# Patient Record
Sex: Male | Born: 1950 | Race: White | Hispanic: No | Marital: Married | Smoking: Former smoker
Health system: Southern US, Community
[De-identification: ages and names within clinical notes are randomized; demographics above are authoritative.]

## PROBLEM LIST (undated history)

## (undated) DIAGNOSIS — I10 Essential (primary) hypertension: Secondary | ICD-10-CM

## (undated) DIAGNOSIS — F329 Major depressive disorder, single episode, unspecified: Secondary | ICD-10-CM

## (undated) DIAGNOSIS — E669 Obesity, unspecified: Secondary | ICD-10-CM

## (undated) DIAGNOSIS — M1A9XX0 Chronic gout, unspecified, without tophus (tophi): Secondary | ICD-10-CM

## (undated) DIAGNOSIS — K222 Esophageal obstruction: Secondary | ICD-10-CM

## (undated) DIAGNOSIS — K219 Gastro-esophageal reflux disease without esophagitis: Secondary | ICD-10-CM

## (undated) DIAGNOSIS — J45909 Unspecified asthma, uncomplicated: Secondary | ICD-10-CM

## (undated) DIAGNOSIS — K5730 Diverticulosis of large intestine without perforation or abscess without bleeding: Secondary | ICD-10-CM

## (undated) DIAGNOSIS — K449 Diaphragmatic hernia without obstruction or gangrene: Secondary | ICD-10-CM

## (undated) DIAGNOSIS — Z800 Family history of malignant neoplasm of digestive organs: Secondary | ICD-10-CM

## (undated) DIAGNOSIS — F32A Depression, unspecified: Secondary | ICD-10-CM

## (undated) DIAGNOSIS — T7840XA Allergy, unspecified, initial encounter: Secondary | ICD-10-CM

## (undated) DIAGNOSIS — E785 Hyperlipidemia, unspecified: Secondary | ICD-10-CM

## (undated) DIAGNOSIS — M1A00X0 Idiopathic chronic gout, unspecified site, without tophus (tophi): Secondary | ICD-10-CM

## (undated) DIAGNOSIS — R1310 Dysphagia, unspecified: Secondary | ICD-10-CM

## (undated) DIAGNOSIS — E119 Type 2 diabetes mellitus without complications: Secondary | ICD-10-CM

## (undated) DIAGNOSIS — G4730 Sleep apnea, unspecified: Secondary | ICD-10-CM

## (undated) HISTORY — DX: Hyperlipidemia, unspecified: E78.5

## (undated) HISTORY — DX: Sleep apnea, unspecified: G47.30

## (undated) HISTORY — DX: Dysphagia, unspecified: R13.10

## (undated) HISTORY — DX: Allergy, unspecified, initial encounter: T78.40XA

## (undated) HISTORY — DX: Unspecified asthma, uncomplicated: J45.909

## (undated) HISTORY — DX: Diverticulosis of large intestine without perforation or abscess without bleeding: K57.30

## (undated) HISTORY — PX: NASAL SEPTUM SURGERY: SHX37

## (undated) HISTORY — DX: Chronic gout, unspecified, without tophus (tophi): M1A.9XX0

## (undated) HISTORY — DX: Esophageal obstruction: K22.2

## (undated) HISTORY — DX: Depression, unspecified: F32.A

## (undated) HISTORY — PX: ANKLE SURGERY: SHX546

## (undated) HISTORY — PX: ESOPHAGOGASTRODUODENOSCOPY: SHX1529

## (undated) HISTORY — DX: Family history of malignant neoplasm of digestive organs: Z80.0

## (undated) HISTORY — DX: Obesity, unspecified: E66.9

## (undated) HISTORY — PX: UMBILICAL HERNIA REPAIR: SHX196

## (undated) HISTORY — DX: Essential (primary) hypertension: I10

## (undated) HISTORY — DX: Idiopathic chronic gout, unspecified site, without tophus (tophi): M1A.00X0

## (undated) HISTORY — DX: Gastro-esophageal reflux disease without esophagitis: K21.9

## (undated) HISTORY — DX: Diaphragmatic hernia without obstruction or gangrene: K44.9

## (undated) HISTORY — DX: Major depressive disorder, single episode, unspecified: F32.9

## (undated) HISTORY — PX: COLONOSCOPY: SHX174

## (undated) HISTORY — DX: Type 2 diabetes mellitus without complications: E11.9

---

## 1998-09-24 ENCOUNTER — Ambulatory Visit (HOSPITAL_COMMUNITY): Admission: RE | Admit: 1998-09-24 | Discharge: 1998-09-24 | Payer: Self-pay | Admitting: Gastroenterology

## 2001-07-13 ENCOUNTER — Encounter: Payer: Self-pay | Admitting: Pulmonary Disease

## 2001-09-13 ENCOUNTER — Encounter: Payer: Self-pay | Admitting: Gastroenterology

## 2004-09-09 ENCOUNTER — Ambulatory Visit: Payer: Self-pay | Admitting: Gastroenterology

## 2004-09-28 ENCOUNTER — Ambulatory Visit: Payer: Self-pay | Admitting: Gastroenterology

## 2005-11-18 ENCOUNTER — Encounter: Payer: Self-pay | Admitting: Pulmonary Disease

## 2006-09-07 ENCOUNTER — Encounter: Admission: RE | Admit: 2006-09-07 | Discharge: 2006-09-07 | Payer: Self-pay | Admitting: Sports Medicine

## 2008-12-04 DIAGNOSIS — K449 Diaphragmatic hernia without obstruction or gangrene: Secondary | ICD-10-CM | POA: Insufficient documentation

## 2008-12-04 DIAGNOSIS — K222 Esophageal obstruction: Secondary | ICD-10-CM

## 2008-12-04 DIAGNOSIS — R1310 Dysphagia, unspecified: Secondary | ICD-10-CM | POA: Insufficient documentation

## 2008-12-04 DIAGNOSIS — I10 Essential (primary) hypertension: Secondary | ICD-10-CM | POA: Insufficient documentation

## 2008-12-04 DIAGNOSIS — F329 Major depressive disorder, single episode, unspecified: Secondary | ICD-10-CM

## 2008-12-04 DIAGNOSIS — M1A00X0 Idiopathic chronic gout, unspecified site, without tophus (tophi): Secondary | ICD-10-CM | POA: Insufficient documentation

## 2008-12-04 DIAGNOSIS — K5730 Diverticulosis of large intestine without perforation or abscess without bleeding: Secondary | ICD-10-CM | POA: Insufficient documentation

## 2008-12-04 DIAGNOSIS — G4730 Sleep apnea, unspecified: Secondary | ICD-10-CM | POA: Insufficient documentation

## 2008-12-04 DIAGNOSIS — E663 Overweight: Secondary | ICD-10-CM | POA: Insufficient documentation

## 2008-12-05 ENCOUNTER — Ambulatory Visit: Payer: Self-pay | Admitting: Gastroenterology

## 2008-12-05 DIAGNOSIS — E1165 Type 2 diabetes mellitus with hyperglycemia: Secondary | ICD-10-CM

## 2009-02-03 ENCOUNTER — Ambulatory Visit: Payer: Self-pay | Admitting: Gastroenterology

## 2009-02-03 ENCOUNTER — Encounter: Payer: Self-pay | Admitting: Gastroenterology

## 2009-02-03 DIAGNOSIS — K2970 Gastritis, unspecified, without bleeding: Secondary | ICD-10-CM | POA: Insufficient documentation

## 2009-02-03 DIAGNOSIS — K2990 Gastroduodenitis, unspecified, without bleeding: Secondary | ICD-10-CM

## 2009-02-05 ENCOUNTER — Encounter: Payer: Self-pay | Admitting: Gastroenterology

## 2009-02-10 ENCOUNTER — Telehealth: Payer: Self-pay | Admitting: Gastroenterology

## 2009-02-14 ENCOUNTER — Ambulatory Visit: Payer: Self-pay | Admitting: Pulmonary Disease

## 2009-02-14 DIAGNOSIS — G4733 Obstructive sleep apnea (adult) (pediatric): Secondary | ICD-10-CM

## 2009-02-24 ENCOUNTER — Encounter: Admission: RE | Admit: 2009-02-24 | Discharge: 2009-02-24 | Payer: Self-pay | Admitting: Endocrinology

## 2009-03-07 ENCOUNTER — Telehealth (INDEPENDENT_AMBULATORY_CARE_PROVIDER_SITE_OTHER): Payer: Self-pay | Admitting: *Deleted

## 2009-03-11 ENCOUNTER — Telehealth (INDEPENDENT_AMBULATORY_CARE_PROVIDER_SITE_OTHER): Payer: Self-pay | Admitting: *Deleted

## 2009-03-14 ENCOUNTER — Ambulatory Visit: Payer: Self-pay | Admitting: Pulmonary Disease

## 2009-04-29 ENCOUNTER — Telehealth: Payer: Self-pay | Admitting: Pulmonary Disease

## 2009-05-03 ENCOUNTER — Encounter: Payer: Self-pay | Admitting: Pulmonary Disease

## 2010-08-12 ENCOUNTER — Telehealth: Payer: Self-pay | Admitting: Pulmonary Disease

## 2010-08-13 ENCOUNTER — Encounter: Payer: Self-pay | Admitting: Pulmonary Disease

## 2010-08-13 ENCOUNTER — Ambulatory Visit (INDEPENDENT_AMBULATORY_CARE_PROVIDER_SITE_OTHER): Payer: Self-pay | Admitting: Pulmonary Disease

## 2010-08-13 DIAGNOSIS — G4733 Obstructive sleep apnea (adult) (pediatric): Secondary | ICD-10-CM

## 2010-08-19 NOTE — Progress Notes (Signed)
Summary: needs new cpap machine  Phone Note Call from Patient   Caller: Patient Call For: [facility] Summary of Call: patient phoned stated that he needed an order for a new CPAP machine. He uses [facility]. He uses the one with the heated humidifier. Patient can be reached at [phone] Initial call taken by: Vedia Coffer,  August 12, 2010 2:06 PM  Follow-up for Phone Call        Pt states he has had his CPAP machine for approx 8 years now and it is making funny noises. It is still working for now but would like to arrange for a new CPAP through [facility]. Pls advise.Michel [facility]  August 12, 2010 3:51 PM  Additional Follow-up for Phone Call Additional follow up Details #1::        I haven't seen pt in 1 1/2 years....have to have OV before I can do this .Marland KitchenMarland KitchenMarland KitchenN[place] law. Additional Follow-up by: Barbaraann Share MD,  August 12, 2010 4:29 PM    Additional Follow-up for Phone Call Additional follow up Details #2::    pt set to see [facility] tomorrow at 9:30am.Jennifer [facility] CMA  August 12, 2010 4:37 PM

## 2010-09-03 NOTE — Assessment & Plan Note (Signed)
Summary: rov for osa   Copy to:  [place] Primary Provider/Referring Provider:  [facility]  CC:  Follow up appt for OSA.  Pt hasn't been seen since Sept 2010.  Pt is requesting a new cpap machine- states his "makes a funny noise."  Wears cpap machine every night.  Approx 7 hours per night.   Pt denied any complaints with mask or pressure. Marland Kitchen  History of Present Illness: the pt comes in today for f/u of his osa.  He has not been seen since 2010, but has been wearing cpap compliantly.  He had a lot of mask fit issues, but now is doing very well with the resmed quattro fx full face mask.  Overall, he feels he sleeps well and is satisfied with his daytime alertness.  He is working on weight loss.  He denies any pressure tolerance issues.    Current Medications (verified): 1)  Testim 1 % Gel (Testosterone) .... Once Daily 2)  Nexium 40 Mg Cpdr (Esomeprazole Magnesium) .... Once Daily 3)  Clarinex 5 Mg Tabs (Desloratadine) .... Once Daily As Needed 4)  Glimepiride 2 Mg Tabs (Glimepiride) .... Take 1 Tablet 2 To 3 Times Daily As Needed 5)  Hctz .... Once Daily 6)  Tarka 4-240 Mg Cr-Tabs (Trandolapril-Verapamil Hcl) .... Once Daily 7)  Fish Oil 1000 Mg Caps (Omega-3 Fatty Acids) .... Two Times A Day 8)  Citrucel 500 Mg Tabs (Methylcellulose (Laxative)) .... Two Times A Day 9)  Osteo Bi-Flex Adv Double St  Caps (Misc Natural Products) .... Two Times A Day 10)  Nasonex 50 Mcg/act Susp (Mometasone Furoate) .... As Needed 11)  Astepro 0.15 % Soln (Azelastine Hcl) .... As Needed 12)  Multivitamins  Tabs (Multiple Vitamin) .... Two Times A Day 13)  Probiotic  Caps (Misc Intestinal Flora Regulat) .... Once Daily 14)  Aspirin Low Dose 81 Mg Tabs (Aspirin) .... Take 1 Tablet By Mouth Once A Day 15)  Crestor (Unsure of Dosage) .... Take 1 Tablet By Mouth Once A Day  Allergies (verified): 1)  ! Sulfa  Review of Systems  The patient denies shortness of breath with activity, shortness of  breath at rest, productive cough, non-productive cough, coughing up blood, chest pain, irregular heartbeats, acid heartburn, indigestion, loss of appetite, weight change, abdominal pain, difficulty swallowing, sore throat, tooth/dental problems, headaches, nasal congestion/difficulty breathing through nose, sneezing, itching, ear ache, anxiety, depression, hand/feet swelling, joint stiffness or pain, rash, change in color of mucus, and fever.    Vital Signs:  Patient profile:   60 year old male Height:      72 inches Weight:      232 pounds BMI:     31.58 O2 Sat:      94 % on Room air Temp:     98.3 degrees F oral Pulse rate:   71 / minute BP sitting:   140 / 80  (left arm) Cuff size:   regular  Vitals Entered By: Arman Filter LPN (August 13, 2010 9:16 AM)  O2 Flow:  Room air CC: Follow up appt for OSA.  Pt hasn't been seen since Sept 2010.  Pt is requesting a new cpap machine- states his "makes a funny noise."  Wears cpap machine every night.  Approx 7 hours per night.   Pt denied any complaints with mask or pressure.  Comments Medications reviewed with patient Arman Filter LPN  August 13, 2010 9:16 AM    Physical Exam  General:  40 male in  nad Nose:  no skin breakdown or pressure necrosis from cpap mask Extremities:  no edema or cyanosis  Neurologic:  alert, does not appear sleepy, moves all 4.   Impression & Recommendations:  Problem # 1:  OBSTRUCTIVE SLEEP APNEA (ICD-327.23) the pt has known osa and has not followed up with me in quite some time.  He is due for a new machine, and has recently found a comfortable mask that is well fitting.  Will re-optimize pressure on auto mode for a few weeks, then set his machine on the appropriate setting.  I have also encouraged the pt to work on weight loss.  Medications Added to Medication List This Visit: 1)  Clarinex 5 Mg Tabs (Desloratadine) .... Once daily as needed 2)  Hctz  .... Once daily  Other Orders: Est. Patient  Level III (16109) DME Referral (DME)  Patient Instructions: 1)  will get you a new cpap machine and set on auto mode to begin with.  let me know if this is more comfortable for you 2)  work on weight loss 3)  followup with me in one year.

## 2010-10-03 LAB — GLUCOSE, CAPILLARY: Glucose-Capillary: 136 mg/dL — ABNORMAL HIGH (ref 70–99)

## 2010-10-08 ENCOUNTER — Telehealth: Payer: Self-pay | Admitting: Gastroenterology

## 2010-10-08 NOTE — Telephone Encounter (Signed)
Pt with hx of DM, GERD, COLON on 09/28/2004 and 02/03/09 with diverticulosis,otherwise normal.  Colon on 09/28/2004 showing HH, Distant Esophageal Stricture and on 02/03/2009 showing Gastritis and possible occult stricture dilation. LMOM for pt to call back.

## 2010-10-13 NOTE — Telephone Encounter (Signed)
LMOM for pt to call back.

## 2010-10-16 NOTE — Telephone Encounter (Signed)
Pt never called back after 2 messages left.

## 2011-01-14 ENCOUNTER — Telehealth: Payer: Self-pay | Admitting: Pulmonary Disease

## 2011-01-14 NOTE — Telephone Encounter (Signed)
KC, pls advise results of download thanks

## 2011-01-15 ENCOUNTER — Other Ambulatory Visit: Payer: Self-pay | Admitting: Pulmonary Disease

## 2011-01-15 DIAGNOSIS — G4733 Obstructive sleep apnea (adult) (pediatric): Secondary | ICD-10-CM

## 2011-01-15 NOTE — Telephone Encounter (Signed)
Done.   Optimal pressure 14cm, but compliance is poor.  He needs to work on wearing longer and more compliantly

## 2011-01-15 NOTE — Telephone Encounter (Signed)
Download received.  KC has already reviewed the download and order was sent to [facility] for pressure change. Will close this message.

## 2011-08-16 ENCOUNTER — Ambulatory Visit (INDEPENDENT_AMBULATORY_CARE_PROVIDER_SITE_OTHER): Payer: BC Managed Care – PPO | Admitting: Pulmonary Disease

## 2011-08-16 ENCOUNTER — Encounter: Payer: Self-pay | Admitting: Pulmonary Disease

## 2011-08-16 VITALS — BP 136/80 | HR 84 | Temp 97.9°F | Ht 72.0 in | Wt 247.8 lb

## 2011-08-16 DIAGNOSIS — G4733 Obstructive sleep apnea (adult) (pediatric): Secondary | ICD-10-CM

## 2011-08-16 NOTE — Progress Notes (Signed)
  Subjective:    Patient ID: [name] [name], male    DOB: 07-20-1950, 61 y.o.   MRN: [id number]  HPI Patient comes in today for followup of his known obstructive sleep apnea.  He has been wearing CPAP compliantly, and has finally found a good mask that seals well.  He is having a significant issue with leaks.  He is sleeping almost 7 hours a night, and is satisfied with his daytime alertness.  However, he is asking whether his pressure needs to be adjusted.  He has gained 15 pounds in the last one year.   Review of Systems  Constitutional: Negative for fever and unexpected weight change.  HENT: Positive for nosebleeds. Negative for ear pain, congestion, sore throat, rhinorrhea, sneezing, trouble swallowing, dental problem, postnasal drip and sinus pressure.   Eyes: Negative for redness and itching.  Respiratory: Negative for cough, chest tightness, shortness of breath and wheezing.   Cardiovascular: Negative for palpitations and leg swelling.  Gastrointestinal: Negative for nausea and vomiting.  Genitourinary: Negative for dysuria.  Musculoskeletal: Negative for joint swelling.  Skin: Negative for rash.  Neurological: Negative for headaches.  Hematological: Bruises/bleeds easily.  Psychiatric/Behavioral: Negative for dysphoric mood. The patient is not nervous/anxious.        Objective:   Physical Exam Overweight male in no acute distress No skin breakdown or pressure necrosis from the CPAP mask Lower extremities without significant edema, and no cyanosis noted Alert, does not appear to be sleepy, moves all 4 extremities.       Assessment & Plan:

## 2011-08-16 NOTE — Patient Instructions (Signed)
Will set your machine on auto for 2 weeks to re-optimize your pressure.  Will let you know the results. Work on weight loss. followup with me in 1 year, but call if having issues.

## 2011-08-16 NOTE — Assessment & Plan Note (Signed)
The patient is doing fairly well with CPAP, and is having no issues with his mask fit currently.  He is asking whether he needs to have his pressure checked again, and this is fairly easy to do with his current machine.  He has gained 15 pounds since last visit, and may require a higher pressure.  I've encouraged him to work aggressively on weight loss, and to keep up with mask changes and supplies.

## 2011-10-17 ENCOUNTER — Other Ambulatory Visit: Payer: Self-pay | Admitting: Pulmonary Disease

## 2011-10-17 DIAGNOSIS — G4733 Obstructive sleep apnea (adult) (pediatric): Secondary | ICD-10-CM

## 2011-11-04 ENCOUNTER — Telehealth: Payer: Self-pay | Admitting: Gastroenterology

## 2011-11-04 NOTE — Telephone Encounter (Signed)
lmom for pt to call back. Per note on 02/03/2009, he was routine risk and to repeat in 10 years or 2020. Asked him to call back if he wants Dr Jarold Motto to review.

## 2011-11-11 ENCOUNTER — Telehealth: Payer: Self-pay | Admitting: Gastroenterology

## 2011-11-11 NOTE — Telephone Encounter (Signed)
lmom for pt to call back. I have sent for his chart, but if he's having problems, he needs to inform us and colon could be done earlier than recall date.

## 2011-11-11 NOTE — Telephone Encounter (Signed)
Finally got pt's chart and pt had COLONs on 08/17/1993, repeat in 5 yrs is - stool cards; 3/29/200, repeat in 5 years if - stool cards; 09/13/2001 same recall rules; 08/26/2004, same rules for recall. Pt's parent had cancer at age 61. Our system states recall is August, 2020.  This is the 2nd call on this matter, but I haven't spoke to him. Dr Jarold Motto, can you review and advise? Thanks.

## 2011-11-12 NOTE — Telephone Encounter (Signed)
[name] [name] for pt that Dr Jarold Motto reviewed his chart and he OK'd a COLON. He may call me back or ask the receptionist to schedule a Direct COLON.

## 2011-11-26 ENCOUNTER — Telehealth: Payer: Self-pay | Admitting: Gastroenterology

## 2011-11-26 NOTE — Telephone Encounter (Signed)
Pt called back this am to discuss the dates of COLONs. Informed Dr Jarold Motto reviewed his chart on the last call on 11/11/11 and approved a DIRECT COLON for now if he would like. He decided he would like the recall made for 2015 instead of 2020; changed. Pt will call for further issues.

## 2012-08-15 ENCOUNTER — Ambulatory Visit: Payer: BC Managed Care – PPO | Admitting: Pulmonary Disease

## 2012-10-06 ENCOUNTER — Encounter: Payer: Self-pay | Admitting: Pulmonary Disease

## 2012-10-06 ENCOUNTER — Ambulatory Visit (INDEPENDENT_AMBULATORY_CARE_PROVIDER_SITE_OTHER): Payer: BC Managed Care – PPO | Admitting: Pulmonary Disease

## 2012-10-06 ENCOUNTER — Telehealth: Payer: Self-pay | Admitting: *Deleted

## 2012-10-06 VITALS — BP 140/82 | HR 92 | Temp 98.2°F | Ht 72.0 in | Wt 227.4 lb

## 2012-10-06 DIAGNOSIS — G4733 Obstructive sleep apnea (adult) (pediatric): Secondary | ICD-10-CM

## 2012-10-06 NOTE — Telephone Encounter (Signed)
Download has been received from [facility] and in your green folder for review per Pt request.

## 2012-10-06 NOTE — Patient Instructions (Addendum)
Stay on cpap.  Will call you once download received. Work on weight loss followup with me in one year if doing well.

## 2012-10-06 NOTE — Progress Notes (Signed)
  Subjective:    Patient ID: [name] [name], male    DOB: 07/30/1950, 62 y.o.   MRN: [id number]  HPI The pt comes in today for f/u of his known osa.  He is wearing cpap compliantly on the auto setting, and was never changed over to 13cm.  He is having no mask or pressure issues, and feels he sleeps well with excellent daytime alertness. 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    Review of Systems  Constitutional: Negative for fever and unexpected weight change.  HENT: Positive for congestion ( allergies), rhinorrhea and postnasal drip. Negative for ear pain, nosebleeds, sore throat, sneezing, trouble swallowing, dental problem and sinus pressure.   Eyes: Negative for redness and itching.  Respiratory: Negative for cough, chest tightness, shortness of breath and wheezing.   Cardiovascular: Negative for palpitations and leg swelling.  Gastrointestinal: Negative for nausea and vomiting.  Genitourinary: Negative for dysuria.  Musculoskeletal: Negative for joint swelling.  Skin: Negative for rash.  Neurological: Negative for headaches.  Hematological: Does not bruise/bleed easily.  Psychiatric/Behavioral: Negative for dysphoric mood. The patient is not nervous/anxious.        Objective:   Physical Exam Ow male in nad Nose without purulence or discharge noted. No skin breakdown or pressure necrosis from the cpap mask. LE without edema, no cyanosis Alert, does not appear sleepy, moves all 4.        Assessment & Plan:

## 2012-10-06 NOTE — Assessment & Plan Note (Signed)
The pt is doing very well with cpap, and feels his sleep and daytime alertness are adequate.  He is keeping up with his supplies and mask cushions, and is trying to lose some weight.  I have asked him to keep working on this, and to f/u with me in one year if doing well.

## 2012-10-17 ENCOUNTER — Telehealth: Payer: Self-pay | Admitting: Pulmonary Disease

## 2012-10-17 NOTE — Telephone Encounter (Addendum)
LMOM x 1  These results are in Dr Shelle Iron review folder---pt requesting these results.

## 2012-10-18 NOTE — Telephone Encounter (Signed)
Encounter closed in error, sorry

## 2012-10-18 NOTE — Telephone Encounter (Signed)
KC, please advise results thanks

## 2012-10-20 NOTE — Telephone Encounter (Signed)
Let pt know that his download shows excellent control of his sleep apnea on the auto setting.  He can either stay here, or be set on 12-13cm.  His choice.  No need to send back unless he wants to change.

## 2012-10-20 NOTE — Telephone Encounter (Signed)
LMTCB

## 2012-10-23 NOTE — Telephone Encounter (Signed)
Spoke with the pt and notified of recs per [facility] He verbalized understanding Wants to keep CPAP set on auto  Nothing further needed

## 2012-12-19 ENCOUNTER — Encounter: Payer: Self-pay | Admitting: Pulmonary Disease

## 2013-01-19 ENCOUNTER — Other Ambulatory Visit: Payer: Self-pay | Admitting: *Deleted

## 2013-01-19 ENCOUNTER — Other Ambulatory Visit: Payer: BC Managed Care – PPO

## 2013-01-19 ENCOUNTER — Other Ambulatory Visit (INDEPENDENT_AMBULATORY_CARE_PROVIDER_SITE_OTHER): Payer: BC Managed Care – PPO

## 2013-01-19 DIAGNOSIS — E291 Testicular hypofunction: Secondary | ICD-10-CM

## 2013-01-19 DIAGNOSIS — E119 Type 2 diabetes mellitus without complications: Secondary | ICD-10-CM

## 2013-01-19 LAB — MICROALBUMIN / CREATININE URINE RATIO
Creatinine,U: 136 mg/dL
Microalb Creat Ratio: 0.8 mg/g (ref 0.0–30.0)
Microalb, Ur: 1.1 mg/dL (ref 0.0–1.9)

## 2013-01-19 LAB — URINALYSIS
Ketones, ur: NEGATIVE
Leukocytes, UA: NEGATIVE
Nitrite: NEGATIVE
Specific Gravity, Urine: 1.02 (ref 1.000–1.030)
Urobilinogen, UA: 0.2 (ref 0.0–1.0)
pH: 6 (ref 5.0–8.0)

## 2013-01-19 LAB — COMPREHENSIVE METABOLIC PANEL
ALT: 36 U/L (ref 0–53)
BUN: 18 mg/dL (ref 6–23)
CO2: 29 mEq/L (ref 19–32)
Creatinine, Ser: 1 mg/dL (ref 0.4–1.5)
GFR: 78.59 mL/min (ref >60.00)
Total Bilirubin: 1.1 mg/dL (ref 0.3–1.2)

## 2013-01-19 LAB — HEMOGLOBIN A1C: Hgb A1c MFr Bld: 6.6 % — ABNORMAL HIGH (ref 4.6–6.5)

## 2013-01-23 ENCOUNTER — Encounter: Payer: Self-pay | Admitting: Endocrinology

## 2013-01-23 ENCOUNTER — Ambulatory Visit (INDEPENDENT_AMBULATORY_CARE_PROVIDER_SITE_OTHER): Payer: BC Managed Care – PPO | Admitting: Endocrinology

## 2013-01-23 ENCOUNTER — Other Ambulatory Visit: Payer: Self-pay | Admitting: *Deleted

## 2013-01-23 VITALS — BP 130/68 | HR 81 | Temp 97.9°F | Resp 12 | Ht 72.0 in | Wt 230.6 lb

## 2013-01-23 DIAGNOSIS — E785 Hyperlipidemia, unspecified: Secondary | ICD-10-CM

## 2013-01-23 DIAGNOSIS — E119 Type 2 diabetes mellitus without complications: Secondary | ICD-10-CM

## 2013-01-23 MED ORDER — GLIPIZIDE 5 MG PO TABS: 5.0000 mg | ORAL_TABLET | Freq: Every day | ORAL | Status: DC

## 2013-01-23 NOTE — Progress Notes (Signed)
Patient ID: [name] [name], male   DOB: [birth year], 62 y.o.   MRN: [id number]  Reason for Appointment: Diabetes follow-up   History of Present Illness   DIAGNOSIS:  Initially made in 2010, Type 2 diabetes mellitus.    He is on a regimen of Victoza 1.8 mg and glipizide ER 5 mg daily and also Invokana. With adding Invokana his A1c has improved as well as he has less postprandial hyperglycemia. No side effects from this.  Although he has not lost any weight recently he is trying to  exercise more regularly. Will have occasional higher postprandial readings but overall blood sugars are excellent.   Peripheral neuropathy: None.  Diabetic nephropathy: Microalbumin ratio: 6.1.  Side effects from medications: None.  Proper timing of medications in relation to meals: Yes.  Monitors blood glucose: Once a day.  Glucometer: One Touch. Did not bring his monitor  Blood Glucose readings: PC high readings of 160 PC. Fasting  average 130   Hypoglycemia frequency: None recently.  Meals: content is variable, he finds that high carbohydrate foods and potato chips will raise his blood sugar. Sometimes hi fat Physical activity: exercise: using bike 3/7.  Certified Diabetes Educator visit: Most recent: 3/13.   Dietician visit: Most recent: 4/13. Following recommendations were made: 1. he will add carbohydrates back in to his diet and control them to 30-75 grams per meal. 2. Patient will limit his meat portions to 4-6 oz. at supper.  3. Patient will cut his use of added fat and fried foods in half. 4. Patient will limit snacks to 200 calories or less at night..   The last HbgA1c was 6.3% on 08/30/12; previous levels: 6.8 in 3/13  Hyperlipidemia:   Patient is on low-dose Crestor and recently on niacin also. No history of CAD.  Last LDL on record is 96 and 03/2012 LDL particle number initially was 1632 . He was advised to start OTC Niacin 500 mg in addition to his low dose Crestor.  He was however sent by his  PCP to do [facility] and they have finally started niacin   He has had myalgias With higher doses of Crestor previously No recent levels available from his visit at [facility] recently    Appointment on 01/19/2013  Component Date Value Range Status  . Hemoglobin A1C 01/19/2013 6.6* 4.6 - 6.5 % Final   Glycemic Control Guidelines for People with Diabetes:Non Diabetic:  <6%Goal of Therapy: <7%Additional Action Suggested:  >8%   . Sodium 01/19/2013 135  135 - 145 mEq/L Final  . Potassium 01/19/2013 4.2  3.5 - 5.1 mEq/L Final  . Chloride 01/19/2013 99  96 - 112 mEq/L Final  . CO2 01/19/2013 29  19 - 32 mEq/L Final  . Glucose, Bld 01/19/2013 133* 70 - 99 mg/dL Final  . BUN 78/29/5621 18  6 - 23 mg/dL Final  . Creatinine, Ser 01/19/2013 1.0  0.4 - 1.5 mg/dL Final  . Total Bilirubin 01/19/2013 1.1  0.3 - 1.2 mg/dL Final  . Alkaline Phosphatase 01/19/2013 56  39 - 117 U/L Final  . AST 01/19/2013 26  0 - 37 U/L Final  . ALT 01/19/2013 36  0 - 53 U/L Final  . Total Protein 01/19/2013 7.0  6.0 - 8.3 g/dL Final  . Albumin 30/86/5784 4.4  3.5 - 5.2 g/dL Final  . Calcium 69/62/9528 9.3  8.4 - 10.5 mg/dL Final  . GFR 41/32/4401 78.59  >60.00 mL/min Final  . Color, Urine 01/19/2013 LT. YELLOW  Yellow;Lt. Yellow Final  . APPearance 01/19/2013 CLEAR  Clear Final  . Specific Gravity, Urine 01/19/2013 1.020  1.000-1.030 Final  . pH 01/19/2013 6.0  5.0 - 8.0 Final  . Total Protein, Urine 01/19/2013 NEGATIVE  Negative Final  . Urine Glucose 01/19/2013 >=1000  Negative Final  . Ketones, ur 01/19/2013 NEGATIVE  Negative Final  . Bilirubin Urine 01/19/2013 NEGATIVE  Negative Final  . Hgb urine dipstick 01/19/2013 NEGATIVE  Negative Final  . Urobilinogen, UA 01/19/2013 0.2  0.0 - 1.0 Final  . Leukocytes, UA 01/19/2013 NEGATIVE  Negative Final  . Nitrite 01/19/2013 NEGATIVE  Negative Final  . Microalb, Ur 01/19/2013 1.1  0.0 - 1.9 mg/dL Final  . Creatinine,U 19/14/7829 136.0   Final  . Microalb Creat Ratio  01/19/2013 0.8  0.0 - 30.0 mg/g Final  . Testosterone 01/19/2013 235* 300 - 890 ng/dL Final   Comment:           Tanner Stage       Male              Male                                        I              < 30 ng/dL        < 10 ng/dL                                        II             < 150 ng/dL       < 30 ng/dL                                        III            100-320 ng/dL     < 35 ng/dL                                        IV             200-970 ng/dL     56-21 ng/dL                                        V/Adult        300-890 ng/dL     30-86 ng/dL                             . Sex Hormone Binding 01/19/2013 20  13 - 71 nmol/L Final  . Testosterone, Free 01/19/2013 58.6  47.0 - 244.0 pg/mL Final   Comment:                            The concentration of free testosterone is derived from a mathematical  expression based on constants for the binding of testosterone to sex                          hormone-binding globulin and albumin.  . Testosterone-% Freee. 01/19/2013 2.5  1.6 - 2.9 % Final      Medication List       This list is accurate as of: 01/23/13  9:35 AM.  Always use your most recent med list.               aspirin 81 MG tablet  Take 81 mg by mouth daily.     aspirin 325 MG tablet  Take 325 mg by mouth daily.     ASTEPRO 0.15 % Soln  Generic drug:  Azelastine HCl  Place into the nose as needed.     CITRUCEL 500 MG Tabs  Generic drug:  Methylcellulose (Laxative)  Take by mouth 2 (two) times daily.     CRESTOR PO  Take 1 tablet by mouth daily. 10 mg     desloratadine 5 MG tablet  Commonly known as:  CLARINEX  Take 5 mg by mouth daily as needed.     esomeprazole 40 MG capsule  Commonly known as:  NEXIUM  Take 40 mg by mouth daily.     Fish Oil 1000 MG Caps  Take by mouth 2 (two) times daily.     fluticasone 50 MCG/ACT nasal spray  Commonly known as:  FLONASE     glimepiride 2 MG tablet  Commonly known as:   AMARYL  Take 1 mg by mouth 2 (two) times daily.     glipiZIDE 5 MG tablet  Commonly known as:  GLUCOTROL  Take 5 mg by mouth daily.     HYDROCHLOROTHIAZIDE PO  Take by mouth daily.     INVOKANA 100 MG Tabs  Generic drug:  Canagliflozin  100 mg.     mometasone 50 MCG/ACT nasal spray  Commonly known as:  NASONEX  as needed.     multivitamin tablet  Take 1 tablet by mouth 2 (two) times daily.     naproxen 500 MG tablet  Commonly known as:  NAPROSYN     niacin 500 MG tablet  Take 500 mg by mouth daily with breakfast.     ONE TOUCH ULTRA TEST test strip  Generic drug:  glucose blood     OSTEO BI-FLEX ADV DOUBLE ST PO  Take by mouth 2 (two) times daily.     PROBIOTIC PO  Take by mouth daily.     testosterone 50 MG/5GM Gel  Commonly known as:  ANDROGEL  daily.     trandolapril-verapamil 4-240 MG per tablet  Commonly known as:  TARKA  Take 1 tablet by mouth daily.     TRANSDERM-SCOP 1.5 MG  Generic drug:  scopolamine     VICTOZA 18 MG/3ML Sopn  Generic drug:  Liraglutide  1.8 mg.        Allergies:  Allergies  Allergen Reactions  . Sulfonamide Derivatives     REACTION: Hives    Past Medical History  Diagnosis Date  . Depression   . Mild obesity   . Chronic gouty arthritis   . Hypertension   . Sleep apnea   . Dysphagia, unspecified(787.20)   . Esophageal stricture   . Hiatal hernia with gastroesophageal reflux   . Diverticulosis of colon   . Family history of colon cancer     Past Surgical History  Procedure Laterality Date  . Ankle surgery      Family History  Problem Relation Age of Onset  . Colon cancer Father     onset at age 45  . Diabetes Mother   . Heart disease Mother   . Heart disease Father     Social History:  reports that he quit smoking about 34 years ago. His smoking use included Cigarettes. He has a 3 pack-year smoking history. He does not have any smokeless tobacco history on file. He reports that  drinks alcohol. He  reports that he does not use illicit drugs.  Review of Systems -  No history of neuropathy  Diagnosis of hypogonadism made by urologist, reports no symptoms now  No history of CAD  Has history of reflux   Examination:   BP 130/68  Pulse 81  Temp(Src) 97.9 F (36.6 C)  Resp 12  Ht 6' (1.829 m)  Wt 230 lb 9.6 oz (104.599 kg)  BMI 31.27 kg/m2  SpO2 96%  Body mass index is 31.27 kg/(m^2).   No ankle edema  Assesment:   Diabetes type 2  The patient's diabetes control appears to be reasonably well-controlled with A1c just above normal Although he feels that his blood sugars are excellent at home after meals his fasting readings tend to be slightly high consistently He will bring his monitor for review on the next visit He agrees that he needs to do better with diet and exercise Since he has gained 3 pounds He will continue Victoza and Invokana also Discussed postprandial targets and need to bring monitor for review  LIPIDS: Now followed by [facility] and he is on a [employer] brand niacin, probably 500 mg but no records available from his last visit in April. 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

## 2013-01-23 NOTE — Patient Instructions (Signed)
Niacin preferred brand is Slo- Niacin  Work on diet and exercise

## 2013-01-25 DIAGNOSIS — E785 Hyperlipidemia, unspecified: Secondary | ICD-10-CM | POA: Insufficient documentation

## 2013-02-09 ENCOUNTER — Telehealth: Payer: Self-pay | Admitting: Pulmonary Disease

## 2013-02-09 NOTE — Telephone Encounter (Signed)
Pt returned to office to speak with someone regarding msg. Spoke with pt - States he was advised by his dentist, Dr. Mellody Dance, that he is grinding his teeth.  Dentist recommended he speak with Dr. Shelle Iron as far as which option he should proceed with.  Would like to know if he should proceed with the device that will only help with the teeth grinding or if there is a device that he could do that will help with the teeth grinding and OSA.  Pt states he will need to wear the device in addition to his cpap.  He is requesting KC's thoughts and guidance on this.  He is ok with [place] calling him back with these recs.  Dr. Shelle Iron, pls advise.  Thank you.  Pls call pt back at [phone].  Thank you.

## 2013-02-09 NOTE — Telephone Encounter (Signed)
Went to the lobby to speak to the pt. Per Irving Burton, his wife was having a procedure done on the 4th floor. They will come back when that is over.

## 2013-02-09 NOTE — Telephone Encounter (Signed)
Per KC: Patient can get mouth guard through Dentist. Dr Shelle Iron wanted pt to know that he has a lot of patients that wear mouth guards with the CPAP and this is okay.  Spoke with wife-- Aware of recs per [facility] Will contact our office back if Dentist rec anything further. Wife stated that in the beginning Dentist was wanting patient to use Dental appliance and patient was placed on CPAP instead.  Okay to move forward with mouth guard.

## 2013-02-16 ENCOUNTER — Telehealth: Payer: Self-pay | Admitting: Endocrinology

## 2013-02-16 MED ORDER — INSULIN LISPRO 100 UNIT/ML (KWIKPEN): PEN_INJECTOR | SUBCUTANEOUS | Status: DC

## 2013-02-16 NOTE — Telephone Encounter (Signed)
i have sent a prescription to your pharmacy, for humalog Call if cbg is over 200.

## 2013-02-16 NOTE — Telephone Encounter (Signed)
please call patient: Does he know how to inject insulin?

## 2013-02-16 NOTE — Telephone Encounter (Signed)
Pt advised.

## 2013-02-16 NOTE — Telephone Encounter (Signed)
Lucianne Muss pt - Ortho physician recommends Cortisone shot in shoulder, this usually increases sugar. May need insulin available. Please call -Sherri

## 2013-02-16 NOTE — Telephone Encounter (Signed)
Pt states he is familiar with injecting insulin, he also uses Victoza

## 2013-02-19 ENCOUNTER — Other Ambulatory Visit: Payer: Self-pay | Admitting: *Deleted

## 2013-02-19 MED ORDER — INSULIN LISPRO 100 UNIT/ML (KWIKPEN): PEN_INJECTOR | SUBCUTANEOUS | Status: DC

## 2013-02-19 NOTE — Telephone Encounter (Signed)
Rx did not go through. Resending  

## 2013-03-23 ENCOUNTER — Encounter: Payer: Self-pay | Admitting: *Deleted

## 2013-05-29 ENCOUNTER — Other Ambulatory Visit: Payer: BC Managed Care – PPO

## 2013-06-01 ENCOUNTER — Ambulatory Visit: Payer: BC Managed Care – PPO | Admitting: Endocrinology

## 2013-06-09 ENCOUNTER — Other Ambulatory Visit: Payer: Self-pay | Admitting: Endocrinology

## 2013-06-15 ENCOUNTER — Other Ambulatory Visit: Payer: BC Managed Care – PPO

## 2013-06-25 ENCOUNTER — Encounter: Payer: Self-pay | Admitting: Endocrinology

## 2013-06-25 ENCOUNTER — Ambulatory Visit (INDEPENDENT_AMBULATORY_CARE_PROVIDER_SITE_OTHER): Payer: BC Managed Care – PPO | Admitting: Endocrinology

## 2013-06-25 VITALS — BP 138/68 | HR 75 | Temp 98.2°F | Resp 12 | Ht 72.0 in | Wt 221.7 lb

## 2013-06-25 MED ORDER — SITAGLIPTIN PHOSPHATE 100 MG PO TABS: 100.0000 mg | ORAL_TABLET | Freq: Every day | ORAL | Status: DC

## 2013-06-25 NOTE — Patient Instructions (Signed)
Januvia 100mg  daily  Please check blood sugars at least half the time about 2 hours after any meal and as directed on waking up. Please bring blood sugar monitor to each visit  Start exercising 4 days a week

## 2013-06-25 NOTE — Progress Notes (Signed)
Patient ID: [name] [name], male   DOB: 26-Feb-1951, 62 y.o.   MRN: [id number]  Reason for Appointment: Diabetes follow-up   History of Present Illness   DIAGNOSIS:  Initially made in 2010, Type 2 diabetes mellitus.    He is on a regimen of glipizide ER 5 mg daily and  Invokana. He had been on Victoza for at least a year using 1.8 mg without difficulty Apparently he had run out of Victoza and when he tried to restart it he was nauseated even with the 0.6 mg. He tried this for 3 days only His blood sugars are generally higher compared to previous readings but he is checking them mostly in the mornings before breakfast He thinks when his A1c was below 6.5 his fasting readings are below 120 Also compliance with his diet and exercise has been poor with not watching the types of foods and not exercising However he seems to have lost weight with cutting back on portions  Monitors blood glucose: Once a day.  Glucometer: One Touch.   Blood Glucose readings: 123-159, midmorning 134-169 with overall median 138  Hypoglycemia: None recently. However when he is doing vigorous exercise he will not take glipizide to avoid hypoglycemia  Meals: content is variable, he finds that high carbohydrate foods and potato chips will raise his blood sugar. May have a high fat meals  Physical activity: exercise: rare recently  Certified Diabetes Educator visit: Most recent: 3/13.   Dietician visit: Most recent: 09/2011. Following recommendations were made then: 1. he will add carbohydrates back in to his diet and control them to 30-75 grams per meal. 2. Patient will limit his meat portions to 4-6 oz. at supper. 3. Patient will cut his use of added fat and fried foods in half. 4. Patient will limit snacks to 200 calories or less at night..   The last HbgA1c was 7.3% done 06/11/13, previously 6.3% on 08/30/12; previous levels: 6.8 in 3/13 Peripheral neuropathy: None.  Diabetic nephropathy: Microalbumin ratio: Previously  6.1.  Side effects from medications: None.   Wt Readings from Last 3 Encounters:  06/25/13 221 lb 11.2 oz (100.562 kg)  01/23/13 230 lb 9.6 oz (104.599 kg)  10/06/12 227 lb 6.4 oz (103.148 kg)    Hyperlipidemia:   Patient is on low-dose Crestor and 1000 mg niacin also. No history of CAD.  LDL particle number initially was 1632 . This is now down to 1095 although particle size still relatively low at 20.3 Other recent labs on 06/11/13: LDL = 80, HDL = 50 and triglycerides 117   No visits with results within 1 Week(s) from this visit. Latest known visit with results is:  Abstract on 03/23/2013  Component Date Value Range Status  . HM Diabetic Eye Exam 02/20/2013 No diabetic retinopathy   Final      Medication List       This list is accurate as of: 06/25/13  1:32 PM.  Always use your most recent med list.               aspirin 81 MG tablet  Take 81 mg by mouth daily.     aspirin 325 MG tablet  Take 325 mg by mouth daily.     ASTEPRO 0.15 % Soln  Generic drug:  Azelastine HCl  Place into the nose as needed.     CITRUCEL 500 MG Tabs  Generic drug:  Methylcellulose (Laxative)  Take by mouth 2 (two) times daily.     CRESTOR  PO  Take 1 tablet by mouth daily. 5 mg     desloratadine 5 MG tablet  Commonly known as:  CLARINEX  Take 5 mg by mouth daily as needed.     esomeprazole 40 MG capsule  Commonly known as:  NEXIUM  Take 40 mg by mouth daily.     Fish Oil 1000 MG Caps  Take by mouth 2 (two) times daily.     FLUARIX QUADRIVALENT 0.5 ML injection  Generic drug:  influenza vac split quadrivalent PF  0.5 mLs.     fluticasone 50 MCG/ACT nasal spray  Commonly known as:  FLONASE     HYDROCHLOROTHIAZIDE PO  Take by mouth daily.     INVOKANA 100 MG Tabs  Generic drug:  Canagliflozin  TAKE 1 TABLET EVERY MORNING BEFORE A MEAL     mometasone 50 MCG/ACT nasal spray  Commonly known as:  NASONEX  as needed.     multivitamin tablet  Take 1 tablet by mouth  2 (two) times daily.     naproxen 500 MG tablet  Commonly known as:  NAPROSYN     niacin 500 MG tablet  Take 1,000 mg by mouth daily with breakfast.     ONE TOUCH ULTRA TEST test strip  Generic drug:  glucose blood     OSTEO BI-FLEX ADV DOUBLE ST PO  Take by mouth 2 (two) times daily.     PROBIOTIC PO  Take by mouth daily.     Testosterone 10 MG/ACT (2%) Gel  Place onto the skin. Two pumps on each side once a day     trandolapril-verapamil 4-240 MG per tablet  Commonly known as:  TARKA  Take 1 tablet by mouth daily.     TRANSDERM-SCOP 1.5 MG  Generic drug:  scopolamine        Allergies:  Allergies  Allergen Reactions  . Sulfonamide Derivatives     REACTION: Hives    Past Medical History  Diagnosis Date  . Depression   . Mild obesity   . Chronic gouty arthritis   . Hypertension   . Sleep apnea   . Dysphagia, unspecified(787.20)   . Esophageal stricture   . Hiatal hernia with gastroesophageal reflux   . Diverticulosis of colon   . Family history of colon cancer     Past Surgical History  Procedure Laterality Date  . Ankle surgery      Family History  Problem Relation Age of Onset  . Colon cancer Father     onset at age 27  . Diabetes Mother   . Heart disease Mother   . Heart disease Father     Social History:  reports that he quit smoking about 35 years ago. His smoking use included Cigarettes. He has a 3 pack-year smoking history. He does not have any smokeless tobacco history on file. He reports that he drinks alcohol. He reports that he does not use illicit drugs.  Review of Systems -  No history of neuropathy  Diagnosis of hypogonadism made by urologist, reports no symptoms now  No history of CAD  Has history of reflux   Examination:   BP 138/68  Pulse 75  Temp(Src) 98.2 F (36.8 C)  Resp 12  Ht 6' (1.829 m)  Wt 221 lb 11.2 oz (100.562 kg)  BMI 30.06 kg/m2  SpO2 97%  Body mass index is 30.06 kg/(m^2).   No ankle  edema  Assesment:   Diabetes type 2  The patient's diabetes control appears  to be worse with A1c higher than usual This is partly from his not taking Victoza for the last 6 weeks and also from poor diet and exercise regimen However his weight is down from his last visit Although his fasting readings are averaging only about 138 his A1c indicates his overall readings are higher and this was discussed with him.  He will be given a trial of Januvia 100 mg daily which would be more convenient and should bring his A1c down to close to target Discussed how this works and sample and information given He agrees that he needs to do better with diet and exercise also He will continue glipizide ER and Invokana also Discussed postprandial targets and need to bring monitor for review  LIPIDS: Now followed by [facility] and he is on 1000 mg Slo-Niacin. 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

## 2013-06-26 ENCOUNTER — Telehealth: Payer: Self-pay | Admitting: *Deleted

## 2013-06-26 NOTE — Telephone Encounter (Signed)
She is concerned about pancreatitis,

## 2013-06-26 NOTE — Telephone Encounter (Signed)
What is the concern? Januvia is very safe He had nausea from Victoza

## 2013-06-26 NOTE — Telephone Encounter (Signed)
Not a proven problem, even Victoza has pancreatitis on the label because of FDA requirement

## 2013-06-26 NOTE — Telephone Encounter (Signed)
Pt's wife has some concern about the Januvia you gave him.  She wants to know know if it would be better for him to start back on the Victoza low dose instead of the Januvia?  Please advise.

## 2013-06-26 NOTE — Telephone Encounter (Signed)
Left message on wife's cell phone.

## 2013-07-31 ENCOUNTER — Other Ambulatory Visit: Payer: Self-pay | Admitting: *Deleted

## 2013-07-31 MED ORDER — SITAGLIPTIN PHOSPHATE 100 MG PO TABS: 100.0000 mg | ORAL_TABLET | Freq: Every day | ORAL | Status: DC

## 2013-09-10 ENCOUNTER — Other Ambulatory Visit: Payer: Self-pay | Admitting: Endocrinology

## 2013-09-10 ENCOUNTER — Other Ambulatory Visit: Payer: Self-pay | Admitting: *Deleted

## 2013-09-10 ENCOUNTER — Telehealth: Payer: Self-pay | Admitting: Endocrinology

## 2013-09-10 NOTE — Telephone Encounter (Signed)
[facility]

## 2013-09-10 NOTE — Telephone Encounter (Signed)
Pt's wife called stating that he is in need of Rx refilled, [facility]  Please refill asap , they are currently out of town and will be at current location for couple of days   Rx: Glipizide 5mg  Call [phone]  Thank You :)

## 2013-10-15 ENCOUNTER — Other Ambulatory Visit (INDEPENDENT_AMBULATORY_CARE_PROVIDER_SITE_OTHER): Payer: BC Managed Care – PPO

## 2013-10-15 DIAGNOSIS — E1165 Type 2 diabetes mellitus with hyperglycemia: Principal | ICD-10-CM

## 2013-10-15 DIAGNOSIS — IMO0001 Reserved for inherently not codable concepts without codable children: Secondary | ICD-10-CM

## 2013-10-15 LAB — MICROALBUMIN / CREATININE URINE RATIO
Creatinine,U: 45.4 mg/dL
MICROALB UR: 0.8 mg/dL (ref 0.0–1.9)
Microalb Creat Ratio: 1.8 mg/g (ref 0.0–30.0)

## 2013-10-15 LAB — BASIC METABOLIC PANEL
BUN: 15 mg/dL (ref 6–23)
CALCIUM: 9 mg/dL (ref 8.4–10.5)
CHLORIDE: 99 meq/L (ref 96–112)
CO2: 28 mEq/L (ref 19–32)
CREATININE: 0.8 mg/dL (ref 0.4–1.5)
GFR: 108.46 mL/min (ref >60.00)
Glucose, Bld: 148 mg/dL — ABNORMAL HIGH (ref 70–99)
Potassium: 4.1 mEq/L (ref 3.5–5.1)
Sodium: 136 mEq/L (ref 135–145)

## 2013-10-15 LAB — URINALYSIS, ROUTINE W REFLEX MICROSCOPIC
Bilirubin Urine: NEGATIVE
HGB URINE DIPSTICK: NEGATIVE
KETONES UR: NEGATIVE
Leukocytes, UA: NEGATIVE
Nitrite: NEGATIVE
RBC / HPF: NONE SEEN (ref 0–?)
Specific Gravity, Urine: <=1.005 — AB (ref 1.000–1.030)
TOTAL PROTEIN, URINE-UPE24: NEGATIVE
Urobilinogen, UA: 0.2 (ref 0.0–1.0)
pH: 6 (ref 5.0–8.0)

## 2013-10-15 LAB — HEMOGLOBIN A1C: Hgb A1c MFr Bld: 7 % — ABNORMAL HIGH (ref 4.6–6.5)

## 2013-10-18 ENCOUNTER — Other Ambulatory Visit: Payer: Self-pay | Admitting: *Deleted

## 2013-10-18 ENCOUNTER — Ambulatory Visit (INDEPENDENT_AMBULATORY_CARE_PROVIDER_SITE_OTHER): Payer: BC Managed Care – PPO | Admitting: Endocrinology

## 2013-10-18 ENCOUNTER — Encounter: Payer: Self-pay | Admitting: Endocrinology

## 2013-10-18 VITALS — BP 128/84 | HR 78 | Temp 98.3°F | Resp 16 | Ht 72.0 in | Wt 234.4 lb

## 2013-10-18 DIAGNOSIS — R5381 Other malaise: Secondary | ICD-10-CM

## 2013-10-18 DIAGNOSIS — E1165 Type 2 diabetes mellitus with hyperglycemia: Principal | ICD-10-CM

## 2013-10-18 DIAGNOSIS — R5383 Other fatigue: Secondary | ICD-10-CM

## 2013-10-18 DIAGNOSIS — E785 Hyperlipidemia, unspecified: Secondary | ICD-10-CM

## 2013-10-18 DIAGNOSIS — IMO0001 Reserved for inherently not codable concepts without codable children: Secondary | ICD-10-CM

## 2013-10-18 LAB — TSH: TSH: 0.55 u[IU]/mL (ref 0.35–5.50)

## 2013-10-18 NOTE — Progress Notes (Signed)
Quick Note:  Please let patient know that the lab result is normal and no further action needed ______ 

## 2013-10-18 NOTE — Progress Notes (Signed)
Patient ID: [name] [name], male   DOB: [birth year], 63 y.o.   MRN: [id number]   Reason for Appointment: Diabetes follow-up   History of Present Illness   DIAGNOSIS: Made in 2010, Type 2 diabetes mellitus.    He is on a regimen of glipizide ER 5 mg , Januvia and  Invokana.  He had been on Victoza for at least a year using 1.8 mg without difficulty Apparently when he tried to use this again after being out of it for a while it he was nauseated even with the 0.6 mg. He tried this for 3 days only For this reason he was started on Januvia in 12/14 when his A1c was 7.3 Although his A1c is somewhat better it is not as good as it was with Victoza Also has gained a significant amount of weight He thinks that is not able to control his diet without Victoza Has no motivation to exercise and also had limitations with knee pain  Monitors blood glucose: Rarely    Glucometer: One Touch.   Blood Glucose readings: None recently Hypoglycemia: None   Meals: content is variable, he knows that high carbohydrate foods and potato chips will raise his blood sugar. He is not watching portions now and may have high fat meals  Physical activity: exercise: rare recently  Certified Diabetes Educator visit: Most recent: 3/13.  Dietician visit: Most recent: 09/2011. Following recommendations were made then: 1. he will add carbohydrates back in to his diet and control them to 30-75 grams per meal. 2. Patient will limit his meat portions to 4-6 oz. at supper. 3. Patient will cut his use of added fat and fried foods in half. 4. Patient will limit snacks to 200 calories or less at night..   The previous HbgA1c was 7.3% done 06/11/13, previously 6.3% on 08/30/12; previous levels: 6.8 in 3/13  Peripheral neuropathy: None.  Diabetic nephropathy: Microalbumin ratio: Previously 6.1.  Side effects from medications: None.   Wt Readings from Last 3 Encounters:  10/18/13 234 lb 6.4 oz (106.323 kg)  06/25/13 221 lb 11.2 oz  (100.562 kg)  01/23/13 230 lb 9.6 oz (104.599 kg)   Lab Results  Component Value Date   HGBA1C 7.0* 10/15/2013   HGBA1C 6.6* 01/19/2013   Lab Results  Component Value Date   MICROALBUR 0.8 10/15/2013   CREATININE 0.8 10/15/2013    Hyperlipidemia:   Patient is on low-dose Crestor and 1000 mg niacin also. No history of CAD.  LDL particle number initially was 1632 . This was 1095 and 12/14 at the lipid clinic although particle size still relatively low at 20.3 Other  labs on 06/11/13: LDL = 80, HDL = 50 and triglycerides 117    Appointment on 10/15/2013  Component Date Value Ref Range Status  . Hemoglobin A1C 10/15/2013 7.0* 4.6 - 6.5 % Final   Glycemic Control Guidelines for People with Diabetes:Non Diabetic:  <6%Goal of Therapy: <7%Additional Action Suggested:  >8%   . Sodium 10/15/2013 136  135 - 145 mEq/L Final  . Potassium 10/15/2013 4.1  3.5 - 5.1 mEq/L Final  . Chloride 10/15/2013 99  96 - 112 mEq/L Final  . CO2 10/15/2013 28  19 - 32 mEq/L Final  . Glucose, Bld 10/15/2013 148* 70 - 99 mg/dL Final  . BUN 10/15/2013 15  6 - 23 mg/dL Final  . Creatinine, Ser 10/15/2013 0.8  0.4 - 1.5 mg/dL Final  . Calcium 10/15/2013 9.0  8.4 - 10.5 mg/dL Final  . GFR  10/15/2013 108.46  >60.00 mL/min Final  . Microalb, Ur 10/15/2013 0.8  0.0 - 1.9 mg/dL Final  . Creatinine,U 10/15/2013 45.4   Final  . Microalb Creat Ratio 10/15/2013 1.8  0.0 - 30.0 mg/g Final  . Color, Urine 10/15/2013 YELLOW  Yellow;Lt. Yellow Final  . APPearance 10/15/2013 CLEAR  Clear Final  . Specific Gravity, Urine 10/15/2013 <=1.005* 1.000 - 1.030 Final  . pH 10/15/2013 6.0  5.0 - 8.0 Final  . Total Protein, Urine 10/15/2013 NEGATIVE  Negative Final  . Urine Glucose 10/15/2013 >=1000* Negative Final  . Ketones, ur 10/15/2013 NEGATIVE  Negative Final  . Bilirubin Urine 10/15/2013 NEGATIVE  Negative Final  . Hgb urine dipstick 10/15/2013 NEGATIVE  Negative Final  . Urobilinogen, UA 10/15/2013 0.2  0.0 - 1.0 Final  .  Leukocytes, UA 10/15/2013 NEGATIVE  Negative Final  . Nitrite 10/15/2013 NEGATIVE  Negative Final  . WBC, UA 10/15/2013 0-2/hpf  0-2/hpf Final  . RBC / HPF 10/15/2013 none seen  0-2/hpf Final  . Squamous Epithelial / LPF 10/15/2013 Rare(0-4/hpf)  Rare(0-4/hpf) Final      Medication List       This list is accurate as of: 10/18/13  8:18 AM.  Always use your most recent med list.               aspirin 81 MG tablet  Take 81 mg by mouth daily.     aspirin 325 MG tablet  Take 325 mg by mouth daily.     ASTEPRO 0.15 % Soln  Generic drug:  Azelastine HCl  Place into the nose as needed.     CITRUCEL 500 MG Tabs  Generic drug:  Methylcellulose (Laxative)  Take by mouth 2 (two) times daily.     CRESTOR PO  Take 1 tablet by mouth daily. 5 mg     desloratadine 5 MG tablet  Commonly known as:  CLARINEX  Take 5 mg by mouth daily as needed.     esomeprazole 40 MG capsule  Commonly known as:  NEXIUM  Take 40 mg by mouth daily.     Fish Oil 1000 MG Caps  Take by mouth 2 (two) times daily.     FLUARIX QUADRIVALENT 0.5 ML injection  Generic drug:  influenza vac split quadrivalent PF  0.5 mLs.     fluticasone 50 MCG/ACT nasal spray  Commonly known as:  FLONASE     glipiZIDE 5 MG tablet  Commonly known as:  GLUCOTROL  TAKE 1 TABLET DAILY     HYDROCHLOROTHIAZIDE PO  Take by mouth daily.     INVOKANA 100 MG Tabs  Generic drug:  Canagliflozin  TAKE 1 TABLET EVERY MORNING BEFORE A MEAL     mometasone 50 MCG/ACT nasal spray  Commonly known as:  NASONEX  as needed.     multivitamin tablet  Take 1 tablet by mouth 2 (two) times daily.     naproxen 500 MG tablet  Commonly known as:  NAPROSYN     niacin 500 MG tablet  Take 1,000 mg by mouth daily with breakfast.     ONE TOUCH ULTRA TEST test strip  Generic drug:  glucose blood     OSTEO BI-FLEX ADV DOUBLE ST PO  Take by mouth 2 (two) times daily.     PROBIOTIC PO  Take by mouth daily.     sitaGLIPtin 100 MG tablet   Commonly known as:  JANUVIA  Take 1 tablet (100 mg total) by mouth daily.  Testosterone 10 MG/ACT (2%) Gel  Place onto the skin. Two pumps on each side once a day     trandolapril-verapamil 4-240 MG per tablet  Commonly known as:  TARKA  Take 1 tablet by mouth daily.     TRANSDERM-SCOP 1 MG/3DAYS  Generic drug:  scopolamine        Allergies:  Allergies  Allergen Reactions  . Sulfonamide Derivatives     REACTION: Hives    Past Medical History  Diagnosis Date  . Depression   . Mild obesity   . Chronic gouty arthritis   . Hypertension   . Sleep apnea   . Dysphagia, unspecified(787.20)   . Esophageal stricture   . Hiatal hernia with gastroesophageal reflux   . Diverticulosis of colon   . Family history of colon cancer     Past Surgical History  Procedure Laterality Date  . Ankle surgery      Family History  Problem Relation Age of Onset  . Colon cancer Father     onset at age 27  . Diabetes Mother   . Heart disease Mother   . Heart disease Father     Social History:  reports that he quit smoking about 35 years ago. His smoking use included Cigarettes. He has a 3 pack-year smoking history. He does not have any smokeless tobacco history on file. He reports that he drinks alcohol. He reports that he does not use illicit drugs.  Review of Systems -  Complaining of fatigue and asking about thyroid disease.  He is on CPAP for sleep apnea  No history of neuropathy  Diagnosis of hypogonadism made by urologist previously, not on treatment  No history of CAD    Examination:   BP 128/84  Pulse 78  Temp(Src) 98.3 F (36.8 C)  Resp 16  Ht 6' (1.829 m)  Wt 234 lb 6.4 oz (106.323 kg)  BMI 31.78 kg/m2  SpO2 93%  Body mass index is 31.78 kg/(m^2).   No ankle edema  Assesment:   Diabetes type 2  The patient's diabetes control appears to be inadequate This is mostly from his not taking Victoza and also from poor diet and exercise regimen Also his  weight has increased significantly He has not checked his blood sugars at home also  Currently his insurance covers only Bydureon and Victoza Discussed how Bydureon is injected and showed him the device He wants to try Victoza again and showed him how to use very low doses even below 0.6 mg to start with and increase the dose very gradually using intermediate doses if needed Strongly encouraged him to watch his diet and start exercise bike regularly for weight loss He will continue glipizide ER and Invokana also Discussed blood sugar targets and need to start checking his blood sugars regularly and bring monitor for review  LIPIDS: Now followed by [facility] and he is on 1000 mg Slo-Niacin. He is going to followup next month and can be subsequently seen here    Has history of high particle number which needs to be followed periodically  HYPERTENSION: Treated by PCP  Fatigue: Will check TSH  Counseling time over 50% of today's 25 minute visit  Elayne Snare 10/18/2013, 8:18 AM

## 2013-10-18 NOTE — Patient Instructions (Addendum)
Please check blood sugars at least half the time about 2 hours after any meal and 3-4x per week on waking up.  Please bring blood sugar monitor to each visit  Walk/bike daily  Victoza 5 clicks before 5.6CLEXN and if tolerated go up as discussed, if nauseated will switch to [employer]

## 2013-10-29 ENCOUNTER — Telehealth: Payer: Self-pay | Admitting: *Deleted

## 2013-10-29 MED ORDER — LIRAGLUTIDE 18 MG/3ML ~~LOC~~ SOPN: 1.8000 mg | PEN_INJECTOR | Freq: Every day | SUBCUTANEOUS | Status: DC

## 2013-10-29 NOTE — Telephone Encounter (Signed)
Please send. 3 pens per month 90 day rx

## 2013-10-29 NOTE — Telephone Encounter (Signed)
Rx sent to the pharmacy by e-script.//AB/CMA 

## 2013-11-26 ENCOUNTER — Encounter: Payer: Self-pay | Admitting: Internal Medicine

## 2013-12-03 ENCOUNTER — Encounter: Payer: Self-pay | Admitting: Internal Medicine

## 2014-01-17 ENCOUNTER — Other Ambulatory Visit (INDEPENDENT_AMBULATORY_CARE_PROVIDER_SITE_OTHER): Payer: BC Managed Care – PPO

## 2014-01-17 DIAGNOSIS — E785 Hyperlipidemia, unspecified: Secondary | ICD-10-CM

## 2014-01-17 DIAGNOSIS — E1165 Type 2 diabetes mellitus with hyperglycemia: Principal | ICD-10-CM

## 2014-01-17 DIAGNOSIS — IMO0001 Reserved for inherently not codable concepts without codable children: Secondary | ICD-10-CM

## 2014-01-17 LAB — BASIC METABOLIC PANEL
BUN: 15 mg/dL (ref 6–23)
CALCIUM: 8.8 mg/dL (ref 8.4–10.5)
CHLORIDE: 102 meq/L (ref 96–112)
CO2: 31 mEq/L (ref 19–32)
Creatinine, Ser: 0.9 mg/dL (ref 0.4–1.5)
GFR: 96.68 mL/min (ref >60.00)
Glucose, Bld: 154 mg/dL — ABNORMAL HIGH (ref 70–99)
Potassium: 4.2 mEq/L (ref 3.5–5.1)
Sodium: 138 mEq/L (ref 135–145)

## 2014-01-17 LAB — LIPID PANEL
CHOL/HDL RATIO: 3
Cholesterol: 129 mg/dL (ref 0–200)
HDL: 40.8 mg/dL (ref >39.00)
LDL Cholesterol: 61 mg/dL (ref 0–99)
NONHDL: 88.2
Triglycerides: 136 mg/dL (ref 0.0–149.0)
VLDL: 27.2 mg/dL (ref 0.0–40.0)

## 2014-01-17 LAB — MICROALBUMIN / CREATININE URINE RATIO
CREATININE, U: 117.2 mg/dL
MICROALB UR: 1 mg/dL (ref 0.0–1.9)
Microalb Creat Ratio: 0.9 mg/g (ref 0.0–30.0)

## 2014-01-17 LAB — HEMOGLOBIN A1C: HEMOGLOBIN A1C: 7.1 % — AB (ref 4.6–6.5)

## 2014-01-21 ENCOUNTER — Ambulatory Visit (INDEPENDENT_AMBULATORY_CARE_PROVIDER_SITE_OTHER): Payer: BC Managed Care – PPO | Admitting: Endocrinology

## 2014-01-21 ENCOUNTER — Encounter: Payer: Self-pay | Admitting: Endocrinology

## 2014-01-21 VITALS — BP 159/87 | HR 79 | Temp 98.3°F | Resp 16 | Ht 72.0 in | Wt 231.6 lb

## 2014-01-21 DIAGNOSIS — IMO0001 Reserved for inherently not codable concepts without codable children: Secondary | ICD-10-CM

## 2014-01-21 DIAGNOSIS — E785 Hyperlipidemia, unspecified: Secondary | ICD-10-CM

## 2014-01-21 DIAGNOSIS — E1165 Type 2 diabetes mellitus with hyperglycemia: Principal | ICD-10-CM

## 2014-01-21 MED ORDER — GLIPIZIDE ER 5 MG PO TB24: 5.0000 mg | ORAL_TABLET | Freq: Every day | ORAL | Status: DC

## 2014-01-21 NOTE — Patient Instructions (Signed)
Please check blood sugars at least half the time about 2 hours after any meal and 2 times per week on waking up. Please bring blood sugar monitor to each visit  Switch to Glipizide ER

## 2014-01-21 NOTE — Progress Notes (Signed)
Patient ID: [name] [name], male   DOB: 04/26/1951, 63 y.o.   MRN: [id number]   Reason for Appointment: Diabetes follow-up   History of Present Illness   DIAGNOSIS: Made in 2010, Type 2 diabetes mellitus.    He is on a regimen of glipizide 5 mg , Januvia and  Invokana.  He had been restarted on Victoza 4/15 when he was having a tendency to weight gain and not controlling his portions. A1c was also relatively higher at 7% and has been as low as 6.3 previously He thinks that which gradually increasing the dose he has been not tolerating 1.8 mg without difficulty He has also started an exercise program and his weight is somewhat better However his A1c does not appear to be improved Fasting lab work on 7/23 shows fasting glucose 154 Apparently he is glipizide 5 mg regular preparation instead of the ER which he was previously taking  Monitors blood glucose: Sporadically    Glucometer: One Touch.   Blood Glucose readings:  After lunch recent range 113-203 with only a few readings. Not checking at other times Hypoglycemia: None   Meals: content is variable, has only occasionally used much carbohydrate He knows that high carbohydrate foods and potato chips will raise his blood sugar. He is not watching portions now and may have high fat meals  Physical activity: exercise:  Diabetes Educator visit: Most recent: 3/13 .  Dietician visit: Most recent: 09/2011. Following recommendations were made then: 1. he will add carbohydrates back in to his diet and control them to 30-75 grams per meal. 2. Patient will limit his meat portions to 4-6 oz. at supper. 3. Patient will cut his use of added fat and fried foods in half. 4. Patient will limit snacks to 200 calories or less at night..   Peripheral neuropathy: None, has no numbness or tingling in his feet .  Diabetic nephropathy:  none, Microalbumin ratio: normal  Side effects from medications: None.   Wt Readings from Last 3 Encounters:  01/21/14  231 lb 9.6 oz (105.053 kg)  10/18/13 234 lb 6.4 oz (106.323 kg)  06/25/13 221 lb 11.2 oz (100.562 kg)   Lab Results  Component Value Date   HGBA1C 7.1* 01/17/2014   HGBA1C 7.0* 10/15/2013   HGBA1C 6.6* 01/19/2013   Lab Results  Component Value Date   MICROALBUR 1.0 01/17/2014   LDLCALC 61 01/17/2014   CREATININE 0.9 01/17/2014    Hyperlipidemia:   Patient is on low-dose Crestor and 1000 mg niacin also. No history of CAD.  LDL particle number initially was 1632 . This was 1095 and 12/14 at the lipid clinic although particle size still relatively low at 20.3 Other  labs on 06/11/13: LDL = 80, HDL = 50 and triglycerides 117  As   Appointment on 01/17/2014  Component Date Value Ref Range Status  . Hemoglobin A1C 01/17/2014 7.1* 4.6 - 6.5 % Final   Glycemic Control Guidelines for People with Diabetes:Non Diabetic:  <6%Goal of Therapy: <7%Additional Action Suggested:  >8%   . Cholesterol 01/17/2014 129  0 - 200 mg/dL Final   ATP III Classification       Desirable:  < 200 mg/dL               Borderline High:  200 - 239 mg/dL          High:  > = 240 mg/dL  . Triglycerides 01/17/2014 136.0  0.0 - 149.0 mg/dL Final   Normal:  <150 mg/dLBorderline  High:  150 - 199 mg/dL  . HDL 01/17/2014 40.80  >39.00 mg/dL Final  . VLDL 01/17/2014 27.2  0.0 - 40.0 mg/dL Final  . LDL Cholesterol 01/17/2014 61  0 - 99 mg/dL Final  . Total CHOL/HDL Ratio 01/17/2014 3   Final                  Men          Women1/2 Average Risk     3.4          3.3Average Risk          5.0          4.42X Average Risk          9.6          7.13X Average Risk          15.0          11.0                      . NonHDL 01/17/2014 88.20   Final   NOTE:  Non-HDL goal should be 30 mg/dL higher than patient's LDL goal (i.e. LDL goal of < 70 mg/dL, would have non-HDL goal of < 100 mg/dL)  . Sodium 01/17/2014 138  135 - 145 mEq/L Final  . Potassium 01/17/2014 4.2  3.5 - 5.1 mEq/L Final  . Chloride 01/17/2014 102  96 - 112 mEq/L Final  .  CO2 01/17/2014 31  19 - 32 mEq/L Final  . Glucose, Bld 01/17/2014 154* 70 - 99 mg/dL Final  . BUN 01/17/2014 15  6 - 23 mg/dL Final  . Creatinine, Ser 01/17/2014 0.9  0.4 - 1.5 mg/dL Final  . Calcium 01/17/2014 8.8  8.4 - 10.5 mg/dL Final  . GFR 01/17/2014 96.68  >60.00 mL/min Final  . Microalb, Ur 01/17/2014 1.0  0.0 - 1.9 mg/dL Final  . Creatinine,U 01/17/2014 117.2   Final  . Microalb Creat Ratio 01/17/2014 0.9  0.0 - 30.0 mg/g Final      Medication List       This list is accurate as of: 01/21/14  8:52 AM.  Always use your most recent med list.               aspirin 81 MG tablet  Take 81 mg by mouth daily.     aspirin 325 MG tablet  Take 325 mg by mouth daily.     ASTEPRO 0.15 % Soln  Generic drug:  Azelastine HCl  Place into the nose as needed.     CITRUCEL 500 MG Tabs  Generic drug:  Methylcellulose (Laxative)  Take by mouth 2 (two) times daily.     CRESTOR PO  Take 1 tablet by mouth daily. 5 mg     desloratadine 5 MG tablet  Commonly known as:  CLARINEX  Take 5 mg by mouth daily as needed.     esomeprazole 40 MG capsule  Commonly known as:  NEXIUM  Take 40 mg by mouth daily.     Fish Oil 1000 MG Caps  Take by mouth 2 (two) times daily.     FLUARIX QUADRIVALENT 0.5 ML injection  Generic drug:  influenza vac split quadrivalent PF  0.5 mLs.     fluticasone 50 MCG/ACT nasal spray  Commonly known as:  FLONASE     glipiZIDE 5 MG tablet  Commonly known as:  GLUCOTROL  TAKE 1 TABLET DAILY     HYDROCHLOROTHIAZIDE PO  Take by mouth daily.     INVOKANA 100 MG Tabs  Generic drug:  Canagliflozin  TAKE 1 TABLET EVERY MORNING BEFORE A MEAL     Liraglutide 18 MG/3ML Sopn  Inject 1.8 mg into the skin daily.     mometasone 50 MCG/ACT nasal spray  Commonly known as:  NASONEX  as needed.     multivitamin tablet  Take 1 tablet by mouth 2 (two) times daily.     naproxen 500 MG tablet  Commonly known as:  NAPROSYN     niacin 500 MG tablet  Take 1,000 mg  by mouth daily with breakfast.     ONE TOUCH ULTRA TEST test strip  Generic drug:  glucose blood     OSTEO BI-FLEX ADV DOUBLE ST PO  Take by mouth 2 (two) times daily.     PROBIOTIC PO  Take by mouth daily.     sitaGLIPtin 100 MG tablet  Commonly known as:  JANUVIA  Take 1 tablet (100 mg total) by mouth daily.     Testosterone 10 MG/ACT (2%) Gel  Place onto the skin. Two pumps on each side once a day     trandolapril-verapamil 4-240 MG per tablet  Commonly known as:  TARKA  Take 1 tablet by mouth daily.     TRANSDERM-SCOP 1 MG/3DAYS  Generic drug:  scopolamine        Allergies:  Allergies  Allergen Reactions  . Liraglutide Nausea And Vomiting  . Metformin Diarrhea    Severe diarrhea  . Sulfonamide Derivatives     REACTION: Hives  . Sulfa Antibiotics Rash    Past Medical History  Diagnosis Date  . Depression   . Mild obesity   . Chronic gouty arthritis   . Hypertension   . Sleep apnea   . Dysphagia, unspecified(787.20)   . Esophageal stricture   . Hiatal hernia with gastroesophageal reflux   . Diverticulosis of colon   . Family history of colon cancer     Past Surgical History  Procedure Laterality Date  . Ankle surgery      Family History  Problem Relation Age of Onset  . Colon cancer Father     onset at age 23  . Diabetes Mother   . Heart disease Mother   . Heart disease Father     Social History:  reports that he quit smoking about 35 years ago. His smoking use included Cigarettes. He has a 3 pack-year smoking history. He does not have any smokeless tobacco history on file. He reports that he drinks alcohol. He reports that he does not use illicit drugs.  Review of Systems -  He is on CPAP for sleep apnea  No history of neuropathy  Diagnosis of hypogonadism made by urologist   No history of CAD    Examination:   BP 159/87  Pulse 79  Temp(Src) 98.3 F (36.8 C)  Resp 16  Ht 6' (1.829 m)  Wt 231 lb 9.6 oz (105.053 kg)  BMI 31.40  kg/m2  SpO2 94%  Body mass index is 31.4 kg/(m^2).   No ankle edema  Assesment:   Diabetes type 2  The patient's diabetes control appears to be fair with A1c not improved He has recently taking Victoza and his weight is slightly better and he is watching his diet some Also able to exercise now May be having relatively higher fasting readings since he is not taking the extended release glipizide  Discussed blood sugar targets and need  to start checking his blood sugars regularly at various times He will call if blood sugars are not controlled  LIPIDS: Now is on 1000 mg Slo-Niacin.  Has history of high particle number which needs to be followed on the next visit  HYPERTENSION: Treated by PCP, blood pressure is relatively high today and he will followup   [name] [name] 01/21/2014, 8:52 AM

## 2014-01-29 ENCOUNTER — Telehealth: Payer: Self-pay | Admitting: *Deleted

## 2014-01-29 ENCOUNTER — Ambulatory Visit (AMBULATORY_SURGERY_CENTER): Payer: Self-pay | Admitting: *Deleted

## 2014-01-29 VITALS — Ht 72.0 in | Wt 230.0 lb

## 2014-01-29 DIAGNOSIS — Z800 Family history of malignant neoplasm of digestive organs: Secondary | ICD-10-CM

## 2014-01-29 MED ORDER — MOVIPREP 100 G PO SOLR: 1.0000 | Freq: Once | ORAL | Status: DC

## 2014-01-29 NOTE — Progress Notes (Signed)
No egg or soy allergy. No anesthesia problems.  No home O2.  No diet meds.  

## 2014-01-29 NOTE — Telephone Encounter (Signed)
Pt was seen in pre-visit today for [place] colonoscopy on 02/08/14 at 8 am.Pt states he has severe sleep apnea, states at last colon here at [facility] with mod sed that he had breathing problems, pt received 50 mcg fentanyl and 6 versed for a double, pt states nurse was very concerned and he had a oxygen face mask on, pt's wife also states that she remembers someone saying he was having trouble breathing, pt is very concerned about having propofol since this is deep sedation. Pt is concerned with loosing airway. I have reviewed previous colon from 2010 and cannot find any documentation on pt having any issues during procedure. Also reviewed procedure report from 2002 in pt's blue chart. Pt's pulmonologist is Dr [name] and pt wants you to speak with him before the procedure. Is pt ok at [facility] or does he need to go to [facility] for difficult airway?-adm

## 2014-01-31 ENCOUNTER — Telehealth: Payer: Self-pay

## 2014-01-31 NOTE — Telephone Encounter (Signed)
April,  I have consulted Dr. Hilarie Fredrickson in this matter and am also interested in what Dr. [name] advises.  Thanks,  [name]

## 2014-01-31 NOTE — Telephone Encounter (Signed)
Message copied by [name] on Thu Jan 31, 2014  4:52 PM ------      Message from: [name]      Created: Thu Jan 31, 2014  2:04 PM      Regarding: FW: Colonoscopy       [name]      Below is [name] opinion regarding [facility] procedure.  He thinks the patient should do fine.      I am okay proceeding in the [place] if you are (and the CRNA on the day of will be okay too)            [name]      Pt called with apprehension about propofol sedation in the [place]. [name] thinks he is okay to proceed, as does his pulm MD, Dr. [name].      Can you please touch base with the patient to update him with these 2 opinions.  If he remains hesitant, then the colon can be canceled and he can come see me in the office.            Thanks everyone            JMP                   ----- Message -----         From: [name], MD         Sent: 01/31/2014  12:39 PM           To: [name], MD      Subject: RE: Colonoscopy                                          Should do fine with planned procedure.  Just have to keep in mind his osa if he has issues afterwards.       ----- Message -----         From: [name], MD         Sent: 01/31/2014   9:33 AM           To: [name], MD      Subject: Colonoscopy                                              [name] [name],      This patient who has been scheduled for surveillance colonoscopy, wanted me to touch base with you regarding his fitness for propofol sedation given his sleep apnea.  My CRNA feels he should be okay for outpatient colon at the [facility], but we will defer to your opinion.  He could be done at [facility] with MAC also.  Thanks for your time and opinion            [name] [name]                   ------

## 2014-01-31 NOTE — Telephone Encounter (Signed)
Left message for patient to call back  

## 2014-02-01 NOTE — Telephone Encounter (Signed)
Patient would like to come meet me in clinic first Please schedule next available office visit We had a long discussion regarding his procedure both in the outpatient endoscopy center in the outpatient hospital setting. He remains nervous because he was told after his last procedure that he had "severe apnea" I offered referral to tertiary care, if this would make him more comfortable or if he has lost trust in our practice, but he prefers to meet me in clinic

## 2014-02-01 NOTE — Telephone Encounter (Signed)
Yes, okay with 1st available MD

## 2014-02-01 NOTE — Telephone Encounter (Signed)
I spoke with the patient and he will not proceed until he speaks to Dr. Hilarie Fredrickson directly.  Dr. Hilarie Fredrickson do you want to call him? Or office visit?

## 2014-02-01 NOTE — Telephone Encounter (Signed)
Pt scheduled for OV with Dr. Hilarie Fredrickson 04/09/14@9 :45am. Left message for pt to call back.  Pt called back and states he will not be in town Oct, Nov, or Dec. Pt wants to know if he can be scheduled with another physician for a visit and colon. Let pt know we would check the schedule and see what could be worked out. Dr. Hilarie Fredrickson do you just want the pt scheduled with the 1st available physician? Please advise.

## 2014-02-02 ENCOUNTER — Other Ambulatory Visit: Payer: Self-pay | Admitting: Endocrinology

## 2014-02-04 NOTE — Telephone Encounter (Signed)
Called to offer pt and appt tomorrow with Dr. Hilarie Fredrickson to discuss issues before recall  Colon. Pt states he is out of town this week and will not be here, wants colon appt cancelled also. Offered to schedule another appt for pt but he states he does not have his calendar with him, states he will call back. Discussed with pt that when he calls back since he has not seen Dr. Hilarie Fredrickson he can scheduled with 1st available provider. Pt aware.

## 2014-02-05 ENCOUNTER — Ambulatory Visit: Payer: BC Managed Care – PPO | Admitting: Internal Medicine

## 2014-02-06 ENCOUNTER — Telehealth: Payer: Self-pay | Admitting: Internal Medicine

## 2014-02-07 NOTE — Telephone Encounter (Signed)
Pts wife called wanting to schedule OV to discuss pts sleep apnea and a colon. Offered OV in October but pt will be out of town through December. Instructed her to call back mid-October to see if the December schedule is out to set up appt. Wife verbalized understanding.

## 2014-02-08 ENCOUNTER — Encounter: Payer: BC Managed Care – PPO | Admitting: Internal Medicine

## 2014-02-12 ENCOUNTER — Telehealth: Payer: Self-pay | Admitting: Endocrinology

## 2014-02-12 ENCOUNTER — Other Ambulatory Visit: Payer: Self-pay | Admitting: *Deleted

## 2014-02-12 MED ORDER — INSULIN PEN NEEDLE 31G X 5 MM MISC: Status: DC

## 2014-02-12 NOTE — Telephone Encounter (Signed)
rx sent

## 2014-02-12 NOTE — Telephone Encounter (Signed)
Patient would like a refill on victoza pen needles 31 gauge  Patient is out   Pharmacy: [facility]

## 2014-03-01 ENCOUNTER — Encounter (INDEPENDENT_AMBULATORY_CARE_PROVIDER_SITE_OTHER): Payer: Self-pay

## 2014-03-01 ENCOUNTER — Encounter: Payer: Self-pay | Admitting: Pulmonary Disease

## 2014-03-01 ENCOUNTER — Ambulatory Visit (INDEPENDENT_AMBULATORY_CARE_PROVIDER_SITE_OTHER): Payer: BC Managed Care – PPO | Admitting: Pulmonary Disease

## 2014-03-01 VITALS — BP 140/90 | HR 60 | Temp 98.6°F | Ht 72.0 in | Wt 233.8 lb

## 2014-03-01 DIAGNOSIS — G4733 Obstructive sleep apnea (adult) (pediatric): Secondary | ICD-10-CM

## 2014-03-01 NOTE — Patient Instructions (Signed)
Continue with cpap, and keep up with mask changes and supplies. Work on weight loss followup with me again in one year.  

## 2014-03-01 NOTE — Progress Notes (Signed)
   Subjective:    Patient ID: [name] [name], male    DOB: Aug 28, 1950, 63 y.o.   MRN: [id number]  HPI The patient comes in today for followup of his obstructive sleep apnea. He is wearing CPAP compliantly by his download, with excellent control of his AHI. He feels that he is doing well with the device, and is having no issues with his mask fit. He is having some morning awakenings around 5 AM where he cannot return to sleep, but he is getting over 6 hours a night with his CPAP device. I have reviewed some sleep hygiene issues with him, and also some behavioral techniques that he can try.   Review of Systems  Constitutional: Negative for fever and unexpected weight change.  HENT: Negative for congestion, dental problem, ear pain, nosebleeds, postnasal drip, rhinorrhea, sinus pressure, sneezing, sore throat and trouble swallowing.   Eyes: Negative for redness and itching.  Respiratory: Negative for cough, chest tightness, shortness of breath and wheezing.   Cardiovascular: Negative for palpitations and leg swelling.  Gastrointestinal: Negative for nausea and vomiting.  Genitourinary: Negative for dysuria.  Musculoskeletal: Negative for joint swelling.  Skin: Negative for rash.  Neurological: Negative for headaches.  Hematological: Does not bruise/bleed easily.  Psychiatric/Behavioral: Negative for dysphoric mood. The patient is not nervous/anxious.        Objective:   Physical Exam Overweight male in no acute distress Nose without purulence or discharge noted Neck without lymphadenopathy or thyromegaly No skin breakdown or pressure necrosis from the CPAP mask Lower extremities without edema, no cyanosis Alert and oriented, moves all 4 extremities.       Assessment & Plan:

## 2014-03-01 NOTE — Assessment & Plan Note (Signed)
The patient is doing very well with CPAP on the automatic setting, and is satisfied with his quality of sleep and daytime alertness. I've asked him to keep up with his equipment changes, and to work aggressively on weight loss.

## 2014-03-28 ENCOUNTER — Other Ambulatory Visit (INDEPENDENT_AMBULATORY_CARE_PROVIDER_SITE_OTHER): Payer: BC Managed Care – PPO

## 2014-03-28 DIAGNOSIS — IMO0002 Reserved for concepts with insufficient information to code with codable children: Secondary | ICD-10-CM

## 2014-03-28 DIAGNOSIS — E1165 Type 2 diabetes mellitus with hyperglycemia: Secondary | ICD-10-CM

## 2014-03-28 DIAGNOSIS — E785 Hyperlipidemia, unspecified: Secondary | ICD-10-CM

## 2014-03-28 LAB — LDL CHOLESTEROL, DIRECT: Direct LDL: 65.8 mg/dL

## 2014-03-28 LAB — COMPREHENSIVE METABOLIC PANEL
ALK PHOS: 56 U/L (ref 39–117)
ALT: 43 U/L (ref 0–53)
AST: 28 U/L (ref 0–37)
Albumin: 4.3 g/dL (ref 3.5–5.2)
BUN: 17 mg/dL (ref 6–23)
CO2: 29 mEq/L (ref 19–32)
Calcium: 8.9 mg/dL (ref 8.4–10.5)
Chloride: 104 mEq/L (ref 96–112)
Creatinine, Ser: 0.9 mg/dL (ref 0.4–1.5)
GFR: 86.03 mL/min (ref >60.00)
Glucose, Bld: 142 mg/dL — ABNORMAL HIGH (ref 70–99)
Potassium: 4.1 mEq/L (ref 3.5–5.1)
SODIUM: 137 meq/L (ref 135–145)
TOTAL PROTEIN: 7.1 g/dL (ref 6.0–8.3)
Total Bilirubin: 1.1 mg/dL (ref 0.2–1.2)

## 2014-03-28 LAB — HEMOGLOBIN A1C: Hgb A1c MFr Bld: 7.1 % — ABNORMAL HIGH (ref 4.6–6.5)

## 2014-03-29 LAB — LIPOPROTEIN ANALYSIS BY NMR
HDL Particle Number: 33.9 umol/L (ref >=30.5)
LDL Particle Number: 942 nmol/L (ref <1000)
LDL SIZE: 20 nm (ref >20.5)
LP-IR Score: 76 — ABNORMAL HIGH (ref <=45)
SMALL LDL PARTICLE NUMBER: 637 nmol/L — AB (ref <=527)

## 2014-04-01 ENCOUNTER — Other Ambulatory Visit: Payer: Self-pay | Admitting: *Deleted

## 2014-04-01 ENCOUNTER — Ambulatory Visit (INDEPENDENT_AMBULATORY_CARE_PROVIDER_SITE_OTHER): Payer: BC Managed Care – PPO | Admitting: Endocrinology

## 2014-04-01 ENCOUNTER — Encounter: Payer: Self-pay | Admitting: Endocrinology

## 2014-04-01 VITALS — BP 118/68 | HR 76 | Temp 98.0°F | Resp 16 | Ht 72.0 in | Wt 233.0 lb

## 2014-04-01 DIAGNOSIS — IMO0002 Reserved for concepts with insufficient information to code with codable children: Secondary | ICD-10-CM

## 2014-04-01 DIAGNOSIS — E1165 Type 2 diabetes mellitus with hyperglycemia: Secondary | ICD-10-CM

## 2014-04-01 DIAGNOSIS — Z23 Encounter for immunization: Secondary | ICD-10-CM

## 2014-04-01 MED ORDER — LIRAGLUTIDE 18 MG/3ML ~~LOC~~ SOPN: PEN_INJECTOR | SUBCUTANEOUS | Status: DC

## 2014-04-01 MED ORDER — INSULIN PEN NEEDLE 31G X 5 MM MISC: Status: DC

## 2014-04-01 MED ORDER — CANAGLIFLOZIN 100 MG PO TABS: ORAL_TABLET | ORAL | Status: DC

## 2014-04-01 MED ORDER — CANAGLIFLOZIN 300 MG PO TABS: ORAL_TABLET | ORAL | Status: DC

## 2014-04-01 NOTE — Patient Instructions (Addendum)
Reg aspirin 30 min before Niacin  Exercise 4 days a week  300mg  Invokana; leave off HCTZ  Please check blood sugars at least half the time about 2 hours after any meal and times per week on waking up. Please bring blood sugar monitor to each visit  May try Glipizide in pm

## 2014-04-01 NOTE — Progress Notes (Signed)
Patient ID: [name] [name], male   DOB: 1951/01/08, 63 y.o.   MRN: [id number]   Reason for Appointment: Diabetes follow-up   History of Present Illness   DIAGNOSIS: Made in 2010, Type 2 diabetes mellitus.    He previously was on a regimen of glipizide 5 mg , Januvia and  Invokana.  He had been restarted on Victoza 4/15 when he was having a tendency to weight gain and not controlling his portions. A1c was also relatively higher at 7% and has been as low as 6.3 previously With gradually increasing the dose he has been tolerating 1.8 mg without difficulty more recently However his A1c has not improved and his weight has leveled off He was switched from glipizide to the extended release preparation and he does not think his fasting readings are high; it was 142 in the lab He still has somewhat inconsistent diet and exercise regimen and he thinks he can do better He thinks he can do best when he tries to eat 5 small meals but at times will eat larger portions Is able to ride his bike twice a week or so  Glucose monitoring: Sporadically, did not bring his monitor    Glucometer: One Touch.   Blood Glucose readings:  Fasting range 110-125, pc upto 185 Hypoglycemia: None but he will get a headache when the blood sugars 80-90  Meals: content is variable, has only occasionally used much carbohydrate He knows that high carbohydrate foods will raise his blood sugar. He is not watching portions all the time Less high fat meals  Physical activity: exercise: Bike 2/7  Diabetes Educator visit: Most recent: 3/13 .  Dietician visit: Most recent: 09/2011. Following recommendations were made then: 1. he will add carbohydrates back in to his diet and control them to 30-75 grams per meal. 2. Patient will limit his meat portions to 4-6 oz. at supper. 3. Patient will cut his use of added fat and fried foods in half. 4. Patient will limit snacks to 200 calories or less at night..   Peripheral neuropathy:  None, has no numbness or tingling in his feet .  Diabetic nephropathy:  none, Microalbumin ratio: normal  Side effects from medications: None.   Wt Readings from Last 3 Encounters:  04/01/14 233 lb (105.688 kg)  03/01/14 233 lb 12.8 oz (106.051 kg)  01/29/14 230 lb (104.327 kg)   Lab Results  Component Value Date   HGBA1C 7.1* 03/28/2014   HGBA1C 7.1* 01/17/2014   HGBA1C 7.0* 10/15/2013   Lab Results  Component Value Date   MICROALBUR 1.0 01/17/2014   LDLCALC 61 01/17/2014   CREATININE 0.9 03/28/2014    Hyperlipidemia:   Patient is on low-dose Crestor and 1000 mg niacin also. No history of CAD.  LDL particle number initially was [id number] . This was 1095 and 12/14 at the lipid clinic although particle size still relatively low at 20.3 Other  labs on 06/11/13: LDL = 80, HDL = 50 and triglycerides 117     Appointment on 03/28/2014  Component Date Value Ref Range Status  . LDL Particle Number 03/28/2014 [id number]  <1000 nmol/L Final   Comment:                           Low                   < 1000  Moderate         1000 - 1299                                                    Borderline-High  1300 - 1599                                                    High             1600 - 2000                                                    Very High             > 2000  . HDL Particle Number 03/28/2014 33.9  >=30.5 umol/L Final  . Small LDL Particle Number 03/28/2014 637* <=527 nmol/L Final  . LDL Size 03/28/2014 20.0  >20.5 nm Final   Comment:  ----------------------------------------------------------                                           ** INTERPRETATIVE INFORMATION**                                           PARTICLE CONCENTRATION AND SIZE                                              <--Lower CVD Risk   Higher CVD Risk-->                            LDL AND HDL PARTICLES   Percentile in Reference Population                            HDL-P  (total)        High     75th    50th    25th   Low                                                 >34.9    34.9    30.5    26.7   <26.7                            Small LDL-P          Low      25th    50th    75th   High                                                 <  117     117     527     839    >839                            LDL Size   <-Large (Pattern A)->    <-Small (Pattern B)->                                              23.0    20.6           20.5      19.0                           ----------------------------------------------------------                          Small LDL-P and LDL Size are associated with CVD risk, but not after                          LDL-P is taken into account.                          These assays were developed and their performance characteristics                          determined by [facility]. These assays have not been cleared by the                          [place] Food and Drug Administration. The clinical utility of these                          laboratory values have not been fully established.  Marland Kitchen LP-IR Score 03/28/2014 76* <=45 Final   Comment: INSULIN RESISTANCE MARKER                              <--Insulin Sensitive    Insulin Resistant-->                                     Percentile in Reference Population                          Insulin Resistance Score                          LP-IR Score   Low   25th   50th   75th   High                                        <27   27     45     63     >63  LP-IR Score is inaccurate if patient is non-fasting.                          The LP-IR score is a laboratory developed index that has been                          associated with insulin resistance and diabetes risk and should be                          used as one component of a physician's clinical assessment. The                          LP-IR score listed above has not been cleared by the [place] Food and                           Drug Administration.  . Sodium 03/28/2014 137  135 - 145 mEq/L Final  . Potassium 03/28/2014 4.1  3.5 - 5.1 mEq/L Final  . Chloride 03/28/2014 104  96 - 112 mEq/L Final  . CO2 03/28/2014 29  19 - 32 mEq/L Final  . Glucose, Bld 03/28/2014 142* 70 - 99 mg/dL Final  . BUN 03/28/2014 17  6 - 23 mg/dL Final  . Creatinine, Ser 03/28/2014 0.9  0.4 - 1.5 mg/dL Final  . Total Bilirubin 03/28/2014 1.1  0.2 - 1.2 mg/dL Final  . Alkaline Phosphatase 03/28/2014 56  39 - 117 U/L Final  . AST 03/28/2014 28  0 - 37 U/L Final  . ALT 03/28/2014 43  0 - 53 U/L Final  . Total Protein 03/28/2014 7.1  6.0 - 8.3 g/dL Final  . Albumin 03/28/2014 4.3  3.5 - 5.2 g/dL Final  . Calcium 03/28/2014 8.9  8.4 - 10.5 mg/dL Final  . GFR 03/28/2014 86.03  >60.00 mL/min Final  . Hemoglobin A1C 03/28/2014 7.1* 4.6 - 6.5 % Final   Glycemic Control Guidelines for People with Diabetes:Non Diabetic:  <6%Goal of Therapy: <7%Additional Action Suggested:  >8%   . Direct LDL 03/28/2014 65.8   Final   Optimal:  <100 mg/dLNear or Above Optimal:  100-129 mg/dLBorderline High:  130-159 mg/dLHigh:  160-189 mg/dLVery High:  >190 mg/dL      Medication List       This list is accurate as of: 04/01/14  8:01 AM.  Always use your most recent med list.               aspirin 81 MG tablet  Take 81 mg by mouth daily.     ASTEPRO 0.15 % Soln  Generic drug:  Azelastine HCl  Place into the nose as needed.     CLARITIN PO  Take by mouth.     CRESTOR PO  Take 1 tablet by mouth daily. 5 mg     FLUARIX QUADRIVALENT 0.5 ML injection  Generic drug:  influenza vac split quadrivalent PF  0.5 mLs.     fluticasone 50 MCG/ACT nasal spray  Commonly known as:  FLONASE     glipiZIDE 5 MG 24 hr tablet  Commonly known as:  GLUCOTROL XL  Take 1 tablet (5 mg total) by mouth daily with breakfast.     HYDROCHLOROTHIAZIDE PO  Take 0.5 tablets by mouth daily.     Insulin Pen Needle 31G X 5  MM Misc  Use to inject victoza     INVOKANA  100 MG Tabs  Generic drug:  Canagliflozin  TAKE 1 TABLET EVERY MORNING BEFORE A MEAL     Melatonin 5 MG Tabs  Take 1 tablet by mouth at bedtime as needed and may repeat dose one time if needed.     mometasone 50 MCG/ACT nasal spray  Commonly known as:  NASONEX  as needed.     MOVIPREP 100 G Solr  Generic drug:  peg 3350 powder  Take 1 kit (200 g total) by mouth once. Name brand only, movi prep as directed, no substitutions.     multivitamin tablet  Take 1 tablet by mouth 2 (two) times daily.     niacin 500 MG tablet  Take 1,000 mg by mouth daily with breakfast.     ONE TOUCH ULTRA TEST test strip  Generic drug:  glucose blood     OSTEO BI-FLEX ADV DOUBLE ST PO  Take by mouth 2 (two) times daily.     PROBIOTIC PO  Take by mouth daily.     trandolapril-verapamil 4-240 MG per tablet  Commonly known as:  TARKA  Take 1 tablet by mouth daily.     VICTOZA 18 MG/3ML Sopn  Generic drug:  Liraglutide  INJECT 1.8 MG INTO THE SKIN DAILY     Vitamin D3 3000 UNITS Tabs  Take 1 tablet by mouth daily.        Allergies:  Allergies  Allergen Reactions  . Liraglutide Nausea And Vomiting    Pt took to big of dose and caused nausea and vomiting, taking off per pt.  . Metformin Diarrhea    Severe diarrhea  . Sulfonamide Derivatives     REACTION: Hives  . Sulfa Antibiotics Rash    Past Medical History  Diagnosis Date  . Depression   . Mild obesity   . Chronic gouty arthritis   . Hypertension   . Dysphagia, unspecified(787.20)   . Esophageal stricture   . Hiatal hernia with gastroesophageal reflux   . Diverticulosis of colon   . Family history of colon cancer   . Allergy     seasonal  . Asthma   . Diabetes mellitus without complication   . Hyperlipidemia   . Sleep apnea     Past Surgical History  Procedure Laterality Date  . Ankle surgery    . Colonoscopy    . Nasal septum surgery    . Umbilical hernia repair    . Esophagogastroduodenoscopy      Family  History  Problem Relation Age of Onset  . Colon cancer Father     onset at age 35  . Heart disease Father   . Diabetes Mother   . Heart disease Mother     Social History:  reports that he quit smoking about 35 years ago. His smoking use included Cigarettes. He has a 3 pack-year smoking history. He has never used smokeless tobacco. He reports that he drinks alcohol. He reports that he does not use illicit drugs.  Review of Systems -  He is on CPAP for sleep apnea  No history of neuropathy  Diagnosis of hypogonadism made by urologist   No history of CAD  Has had recent eye exam  Foot exam was done in 7/15    Examination:   BP 118/68  Pulse 76  Temp(Src) 98 F (36.7 C)  Resp 16  Ht 6' (1.829 m)  Wt 233 lb (105.688 kg)  BMI 31.59 kg/m2  SpO2 95%  Body mass index is 31.59 kg/(m^2).   No ankle edema  Assesment:   Diabetes type 2  The patient's diabetes control appears to be fair with A1c still just over 7% He has been taking Victoza 1.8 mg along with glipizide ER and Invokana 100 mg Although he is doing some exercise he has difficulty losing weight and needs overall better control He should do better with increasing Invokana to 300 mg which should help his diabetes control, weight in blood pressure Discussed potential for hypoglycemia with glipizide ER and may need to reduce the dose as well as not skip any meals Meanwhile he will try to take it in the evening  LIPIDS: Controlled on 1000 mg Slo-Niacin.  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

## 2014-04-09 ENCOUNTER — Ambulatory Visit: Payer: BC Managed Care – PPO | Admitting: Internal Medicine

## 2014-06-10 ENCOUNTER — Telehealth: Payer: Self-pay | Admitting: *Deleted

## 2014-06-10 NOTE — Telephone Encounter (Signed)
Patient is in [place] and is having allergies, he was put on prednisone 3 days ago, his sugars have gone up to 260, the lowest it has been is  184 2 hours after eating.  He said you had changed his medication last time he was here and they were coming down and doing well.  He also said last time this happened you had put him on insulin to get his sugars to come down.  Please advise   [facility] # [id number]    Patient cb# [phone]

## 2014-06-10 NOTE — Telephone Encounter (Signed)
Noted, rx verbally called in, patient is aware

## 2014-06-10 NOTE — Telephone Encounter (Signed)
He can get Novolin 70/30 insulin, start 14 units in the morning before breakfast and go up to 20 if blood sugars are still high.  Also take 8 units before dinner and may stop insulin when blood sugars are below 150

## 2014-06-17 ENCOUNTER — Other Ambulatory Visit: Payer: Self-pay | Admitting: *Deleted

## 2014-06-17 MED ORDER — CANAGLIFLOZIN 300 MG PO TABS: ORAL_TABLET | ORAL | Status: DC

## 2014-06-26 ENCOUNTER — Other Ambulatory Visit: Payer: BC Managed Care – PPO

## 2014-06-26 ENCOUNTER — Ambulatory Visit (AMBULATORY_SURGERY_CENTER): Payer: Self-pay | Admitting: *Deleted

## 2014-06-26 VITALS — Ht 72.0 in | Wt 227.0 lb

## 2014-06-26 DIAGNOSIS — Z800 Family history of malignant neoplasm of digestive organs: Secondary | ICD-10-CM

## 2014-06-26 LAB — HM DIABETES FOOT EXAM: HM Diabetic Foot Exam: NORMAL

## 2014-06-26 LAB — HEMOGLOBIN A1C: Hgb A1c MFr Bld: 6.9 % — AB (ref 4.0–6.0)

## 2014-06-26 MED ORDER — MOVIPREP 100 G PO SOLR: 1.0000 | Freq: Once | ORAL | Status: DC

## 2014-06-26 NOTE — Progress Notes (Signed)
No egg or soy allergy. No anesthesia problems.  No home O2.  No diet meds.  

## 2014-06-27 ENCOUNTER — Other Ambulatory Visit: Payer: Self-pay | Admitting: *Deleted

## 2014-06-27 MED ORDER — CANAGLIFLOZIN 300 MG PO TABS: ORAL_TABLET | ORAL | Status: DC

## 2014-07-01 ENCOUNTER — Encounter: Payer: Self-pay | Admitting: *Deleted

## 2014-07-02 ENCOUNTER — Ambulatory Visit (INDEPENDENT_AMBULATORY_CARE_PROVIDER_SITE_OTHER): Payer: BC Managed Care – PPO | Admitting: Endocrinology

## 2014-07-02 ENCOUNTER — Encounter: Payer: Self-pay | Admitting: Endocrinology

## 2014-07-02 VITALS — BP 146/93 | HR 83 | Temp 98.0°F | Resp 14 | Ht 72.0 in | Wt 225.2 lb

## 2014-07-02 DIAGNOSIS — E119 Type 2 diabetes mellitus without complications: Secondary | ICD-10-CM

## 2014-07-02 DIAGNOSIS — E291 Testicular hypofunction: Secondary | ICD-10-CM

## 2014-07-02 DIAGNOSIS — I10 Essential (primary) hypertension: Secondary | ICD-10-CM

## 2014-07-02 DIAGNOSIS — E782 Mixed hyperlipidemia: Secondary | ICD-10-CM

## 2014-07-02 NOTE — Progress Notes (Signed)
Patient ID: [name] [name], male   DOB: 03-19-1951, 64 y.o.   MRN: [id number]   Reason for Appointment: Diabetes follow-up   History of Present Illness   DIAGNOSIS: Made in 2010, Type 2 diabetes mellitus.    He previously was on a regimen of glipizide 5 mg , Januvia and  Invokana.  He had been restarted on Victoza 4/15 when he was having a tendency to weight gain and not controlling his portions. With gradually increasing the dose he was tolerating 1.8 mg without difficulty  Recent history:  However since his A1c and weight had not improved he was given Invokana 300 mg daily in 9/15 A1c has  improved and his weight has come down significantly although this may be partly from his retiring and being in [place] for 3 months recently leveled off Also he was watching his diet much better when he was out of town. Is able to ride his bike daily when he was in [place] but not doing much exercise recently Recent increase in blood sugar was related to his getting prednisone when he was in [place] or allergies He was given 70/30 insulin for about a week or so when his sugar was over 200 However he is checking his blood sugar mostly around lunchtime and only occasionally at bedtime or in the morning  Glucose monitoring:   Glucometer: One Touch.   Blood Glucose readings:  Fasting checked infrequently, recently 102-144 Nonfasting recently 89-181 but highest reading was 260 about 3 weeks ago on prednisone Average glucose for the last 4 weeks is 157  Hypoglycemia: None but he will get a headache when the blood sugars 80-90  Meals: content is variable, has been better with choices recently.  Less high fat meals  He knows that high carbohydrate foods will raise his blood sugar.   Physical activity: exercise: Exercise bike or biking outside, variable frequency  Diabetes Educator visit: Most recent: 3/13 .  Dietician visit: Most recent: 09/2011. Following recommendations were made then: 1. he will  add carbohydrates back in to his diet and control them to 30-75 grams per meal. 2. Patient will limit his meat portions to 4-6 oz. at supper. 3. Patient will cut his use of added fat and fried foods in half. 4. Patient will limit snacks to 200 calories or less at night..   Peripheral neuropathy: None, has no numbness or tingling in his feet .  Diabetic nephropathy:  none, Microalbumin ratio: normal  Side effects from medications: None.   Wt Readings from Last 3 Encounters:  07/02/14 225 lb 3.2 oz (102.15 kg)  06/26/14 227 lb (102.967 kg)  04/01/14 233 lb (105.688 kg)   Lab Results  Component Value Date   HGBA1C 6.9* 06/26/2014   HGBA1C 7.1* 03/28/2014   HGBA1C 7.1* 01/17/2014   Lab Results  Component Value Date   MICROALBUR 1.0 01/17/2014   LDLCALC 61 01/17/2014   CREATININE 0.9 03/28/2014    Hyperlipidemia:   Patient is on low-dose Crestor and 1000 mg niacin also. No history of CAD.  LDL particle number initially was 1632 . LDL particle number = 1095 in 12/14 at the lipid clinic although particle size still relatively low at 20.3 Other  labs on 06/11/13: LDL = 80, HDL = 50 and triglycerides 117  Recently:  Lab Results  Component Value Date   CHOL 129 01/17/2014   HDL 40.80 01/17/2014   LDLCALC 61 01/17/2014   LDLDIRECT 65.8 03/28/2014   TRIG 136.0 01/17/2014   CHOLHDL  3 01/17/2014                                                 Medication List       This list is accurate as of: 07/02/14 10:02 AM.  Always use your most recent med list.               amLODipine-benazepril 10-20 MG per capsule  Commonly known as:  LOTREL     aspirin 81 MG tablet  Take 81 mg by mouth daily.     ASTEPRO 0.15 % Soln  Generic drug:  Azelastine HCl  Place into the nose as needed.     canagliflozin 300 MG Tabs tablet  Commonly known as:  INVOKANA  Take 1 tablet daily     CRESTOR PO  Take 1 tablet by mouth daily. 5 mg     fexofenadine 30 MG tablet  Commonly known as:   ALLEGRA  Take 180 mg by mouth 2 (two) times daily.     fluticasone 50 MCG/ACT nasal spray  Commonly known as:  FLONASE     glipiZIDE 5 MG 24 hr tablet  Commonly known as:  GLUCOTROL XL  Take 1 tablet (5 mg total) by mouth daily with breakfast.     Insulin Pen Needle 31G X 5 MM Misc  Use to inject victoza     Liraglutide 18 MG/3ML Sopn  Commonly known as:  VICTOZA  INJECT 1.8 MG INTO THE SKIN DAILY     Melatonin 5 MG Tabs  Take 1 tablet by mouth at bedtime as needed and may repeat dose one time if needed.     MOVIPREP 100 G Solr  Generic drug:  peg 3350 powder  Take 1 kit (200 g total) by mouth once. Name brand only, movi prep as directed, no substitutions.     multivitamin tablet  Take 1 tablet by mouth 2 (two) times daily.     niacin 500 MG tablet  Take 1,000 mg by mouth daily with breakfast.     ONE TOUCH ULTRA TEST test strip  Generic drug:  glucose blood     OSTEO BI-FLEX ADV DOUBLE ST PO  Take by mouth 2 (two) times daily.     PROBIOTIC PO  Take by mouth daily.     Vitamin D3 3000 UNITS Tabs  Take 1 tablet by mouth daily.        Allergies:  Allergies  Allergen Reactions  . Metformin Diarrhea    Severe diarrhea  . Sulfonamide Derivatives     REACTION: Hives  . Sulfa Antibiotics Rash    Past Medical History  Diagnosis Date  . Depression   . Mild obesity   . Chronic gouty arthritis   . Hypertension   . Dysphagia, unspecified(787.20)   . Esophageal stricture   . Hiatal hernia with gastroesophageal reflux   . Diverticulosis of colon   . Family history of colon cancer   . Allergy     seasonal  . Asthma   . Diabetes mellitus without complication   . Hyperlipidemia   . Sleep apnea     cpap    Past Surgical History  Procedure Laterality Date  . Ankle surgery    . Colonoscopy    . Nasal septum surgery    . Umbilical hernia repair    . Esophagogastroduodenoscopy  Family History  Problem Relation Age of Onset  . Colon cancer Father      onset at age 23  . Heart disease Father   . Diabetes Mother   . Heart disease Mother     Social History:  reports that he quit smoking about 36 years ago. His smoking use included Cigarettes. He has a 3 pack-year smoking history. He has never used smokeless tobacco. He reports that he drinks alcohol. He reports that he does not use illicit drugs.  Review of Systems -  HYPOGONADISM: He thinks this was diagnosed about 3 or so years ago by his PCP but no details are available He has been on and off testosterone supplements since then from his urologist Not clear if he has had evaluation for pituitary function or even a prolactin level He says he was having symptoms of fatigue and depression the onset and this has not improved with taking testosterone gel Recently has been on 2 pumps on each leg with the [place]  and his level has been in the low 200 range both in August and December 2015 However his recent PSA from the primary care physician is 4.4 and he has not discussed this with his urologist He is asking for an opinion also about this  He is on CPAP for sleep apnea  No history of neuropathy  Diagnosis of hypogonadism made by urologist T=212  No history of CAD  Foot exam was done in 7/15  BP 140-150 at home, he is now on Lotrel from his PCP.  Blood pressure was relatively higher with his PCP at 150/84 HCTZ was stopped when his Invokana was increased    Examination:   BP 146/93 mmHg  Pulse 83  Temp(Src) 98 F (36.7 C)  Resp 14  Ht 6' (1.829 m)  Wt 225 lb 3.2 oz (102.15 kg)  BMI 30.54 kg/m2  SpO2 95%  Body mass index is 30.54 kg/(m^2).   No ankle edema  Assesment:   Diabetes type 2  The patient's diabetes control appears to be fair with A1c  just under 7% However he has done very well with diet and exercise in the last 3 months when he was in [place] and has lost significant amount of weight also Also his Invokana was increased to 300 mg on the last  visit Recent increase in blood sugar was related to his getting prednisone when he was in [place] for allergies He has been taking Victoza 1.8 mg along with glipizide ER and Invokana 300 mg Have discussed need for getting regular exercise when he is not in [place] and also consistent diet He will try to monitor blood sugars as directed at various times Discussed potential for hypoglycemia with glipizide ER and he will change it to the evening  HYPOGONADISM: He does need to be evaluated pituitary hormones and not clear this was done He will try to stop his testosterone gel for about 2-3 weeks after discussion with the urologist and come in for free testosterone, LH, prolactin and free T4 levels  LIPIDS: Controlled on Crestor and 1000 mg Slo-Niacin.  Has history of high particle number and this will be rechecked on the next visit  HYPERTENSION: Treated by PCP, blood pressure is higher with stopping HCTZ and he can go back to 12.5 mg again  Counseling time over 50% of today's 25 minute visit   Million Maharaj 07/02/2014, 10:02 AM

## 2014-07-02 NOTE — Patient Instructions (Signed)
Take Glipizide in pm,  Regular exercise  Please check blood sugars at least half the time about 2 hours after any meal and 3 times per week on waking up. Please bring blood sugar monitor to each visit  1/2 HCTZ daily

## 2014-07-04 ENCOUNTER — Telehealth: Payer: Self-pay | Admitting: *Deleted

## 2014-07-04 ENCOUNTER — Telehealth: Payer: Self-pay | Admitting: Internal Medicine

## 2014-07-04 ENCOUNTER — Other Ambulatory Visit: Payer: Self-pay | Admitting: *Deleted

## 2014-07-04 ENCOUNTER — Telehealth: Payer: Self-pay | Admitting: Endocrinology

## 2014-07-04 ENCOUNTER — Other Ambulatory Visit: Payer: Self-pay | Admitting: Endocrinology

## 2014-07-04 DIAGNOSIS — E291 Testicular hypofunction: Secondary | ICD-10-CM

## 2014-07-04 NOTE — Telephone Encounter (Signed)
Noted, patient is aware. 

## 2014-07-04 NOTE — Telephone Encounter (Signed)
Wife calling you back

## 2014-07-04 NOTE — Telephone Encounter (Signed)
Patient came in today, he wants to know if you will take over care of his testosterone?   He said either you or another Dr. Asked about having his other hormones checked and he was advised he would have to go off of the [place], Patient wants to know how long he would have to stop it before the tests were done.  Please advise.

## 2014-07-04 NOTE — Telephone Encounter (Signed)
Pts wife states he takes one of the amlodipine in the am.

## 2014-07-04 NOTE — Telephone Encounter (Signed)
Stop the testosterone for 3 weeks and come in for morning labs: Free and total testosterone from [facility] [place] and prolactin from [employer].

## 2014-07-10 ENCOUNTER — Encounter: Payer: Self-pay | Admitting: Internal Medicine

## 2014-07-10 ENCOUNTER — Ambulatory Visit (AMBULATORY_SURGERY_CENTER): Payer: BLUE CROSS/BLUE SHIELD | Admitting: Internal Medicine

## 2014-07-10 VITALS — BP 150/83 | HR 71 | Temp 97.6°F | Resp 14 | Ht 72.0 in | Wt 227.0 lb

## 2014-07-10 DIAGNOSIS — D125 Benign neoplasm of sigmoid colon: Secondary | ICD-10-CM

## 2014-07-10 DIAGNOSIS — Z800 Family history of malignant neoplasm of digestive organs: Secondary | ICD-10-CM

## 2014-07-10 DIAGNOSIS — D123 Benign neoplasm of transverse colon: Secondary | ICD-10-CM

## 2014-07-10 DIAGNOSIS — Z1211 Encounter for screening for malignant neoplasm of colon: Secondary | ICD-10-CM

## 2014-07-10 MED ORDER — SODIUM CHLORIDE 0.9 % IV SOLN: 500.0000 mL | INTRAVENOUS | Status: DC

## 2014-07-10 NOTE — Op Note (Addendum)
[facility]  [employer]. [place], [address]   COLONOSCOPY PROCEDURE REPORT  PATIENT: [name], [name]  MR#: [id number] BIRTHDATE: Nov 07, 1950 , 63  yrs. old GENDER: male ENDOSCOPIST: [name], MD PROCEDURE DATE:  07/10/2014 PROCEDURE:   Colonoscopy with cold biopsy polypectomy First Screening Colonoscopy - Avg.  risk and is 50 yrs.  old or older - No.  Prior Negative Screening - Now for repeat screening. Less than 10 yrs Prior Negative Screening - Now for repeat screening.  Above average risk  History of Adenoma - Now for follow-up colonoscopy & has been > or = to 3 yrs.  N/A  Polyps Removed Today? Yes. ASA CLASS:   Class III INDICATIONS:patient's immediate family history of colon cancer and last colonoscopy completed 5 years ago. MEDICATIONS: Monitored anesthesia care and Propofol 200 mg IV  DESCRIPTION OF PROCEDURE:   After the risks benefits and alternatives of the procedure were thoroughly explained, informed consent was obtained.  The digital rectal exam revealed no rectal mass.   The LB [id number]  endoscope was introduced through the anus and advanced to the cecum, which was identified by both the appendix and ileocecal valve. No adverse events experienced. The quality of the prep was good, using MoviPrep  The instrument was then slowly withdrawn as the colon was fully examined.  COLON FINDINGS: Two sessile polyps ranging between 3-78mm in size were found in the transverse colon and sigmoid colon. Polypectomies were performed with cold forceps.  The resection was complete, the polyp tissue was completely retrieved and sent to histology.   There was mild diverticulosis noted in the ascending colon, descending colon, and sigmoid colon with associated muscular hypertrophy.  Retroflexed views revealed internal hemorrhoids. The time to cecum=3 minutes 28 seconds.  Withdrawal time=11 minutes 38 seconds.  The scope was withdrawn and the procedure  completed. COMPLICATIONS: There were no immediate complications.  ENDOSCOPIC IMPRESSION: 1.   Two sessile polyps ranging between 3-14mm in size were found in the transverse colon and sigmoid colon; polypectomies were performed with cold forceps 2.   There was mild diverticulosis noted in the ascending colon, descending colon, and sigmoid colon  RECOMMENDATIONS: 1.  Await pathology results 2.  High fiber diet 3.  Repeat Colonoscopy in 5 years. 4.  You will receive a letter within 1-2 weeks with the results of your biopsy as well as final recommendations.  Please call my office if you have not received a letter after 3 weeks.  eSigned:  [name], MD 07/10/2014 8:32 AM Revised: 07/10/2014 8:32 AM  cc: Lavone Orn, MD and The Patient

## 2014-07-10 NOTE — Patient Instructions (Signed)
YOU HAD AN ENDOSCOPIC PROCEDURE TODAY AT THE [facility]: Refer to the procedure report that was given to you for any specific questions about what was found during the examination.  If the procedure report does not answer your questions, please call your gastroenterologist to clarify.  If you requested that your care partner not be given the details of your procedure findings, then the procedure report has been included in a sealed envelope for you to review at your convenience later.  YOU SHOULD EXPECT: Some feelings of bloating in the abdomen. Passage of more gas than usual.  Walking can help get rid of the air that was put into your GI tract during the procedure and reduce the bloating. If you had a lower endoscopy (such as a colonoscopy or flexible sigmoidoscopy) you may notice spotting of blood in your stool or on the toilet paper. If you underwent a bowel prep for your procedure, then you may not have a normal bowel movement for a few days.  DIET: Your first meal following the procedure should be a light meal and then it is ok to progress to your normal diet.  A half-sandwich or bowl of soup is an example of a good first meal.  Heavy or fried foods are harder to digest and may make you feel nauseous or bloated.  Likewise meals heavy in dairy and vegetables can cause extra gas to form and this can also increase the bloating.  Drink plenty of fluids but you should avoid alcoholic beverages for 24 hours.  ACTIVITY: Your care partner should take you home directly after the procedure.  You should plan to take it easy, moving slowly for the rest of the day.  You can resume normal activity the day after the procedure however you should NOT DRIVE or use heavy machinery for 24 hours (because of the sedation medicines used during the test).    SYMPTOMS TO REPORT IMMEDIATELY: A gastroenterologist can be reached at any hour.  During normal business hours, 8:30 AM to 5:00 PM Monday through Friday,  call [phone].  After hours and on weekends, please call the GI answering service at [phone] who will take a message and have the physician on call contact you.   Following lower endoscopy (colonoscopy or flexible sigmoidoscopy):  Excessive amounts of blood in the stool  Significant tenderness or worsening of abdominal pains  Swelling of the abdomen that is new, acute  Fever of 100F or higher   FOLLOW UP: If any biopsies were taken you will be contacted by phone or by letter within the next 1-3 weeks.  Call your gastroenterologist if you have not heard about the biopsies in 3 weeks.  Our staff will call the home number listed on your records the next business day following your procedure to check on you and address any questions or concerns that you may have at that time regarding the information given to you following your procedure. This is a courtesy call and so if there is no answer at the home number and we have not heard from you through the emergency physician on call, we will assume that you have returned to your regular daily activities without incident.  SIGNATURES/CONFIDENTIALITY: You and/or your care partner have signed paperwork which will be entered into your electronic medical record.  These signatures attest to the fact that that the information above on your After Visit Summary has been reviewed and is understood.  Full responsibility of the confidentiality of   this discharge information lies with you and/or your care-partner.   Resume medications. Information given on polyps,diverticulosis and high fiber diet with discharge instructions. 

## 2014-07-10 NOTE — Progress Notes (Signed)
Called to room to assist during endoscopic procedure.  Patient ID and intended procedure confirmed with present staff. Received instructions for my participation in the procedure from the performing physician.  

## 2014-07-10 NOTE — Progress Notes (Signed)
  [facility] Post-op Note  Patient: [name] [name]  Procedure(s) Performed: colonoscopy  Patient Location: [facility] - Recovery Area  Anesthesia Type: Deep Sedation/Propofol  Level of Consciousness: awake, oriented and patient cooperative  Airway and Oxygen Therapy: Patient Spontanous Breathing  Post-op Pain: none  Post-op Assessment:  Post-op Vital signs reviewed, Patient's Cardiovascular Status Stable, Respiratory Function Stable, Patent Airway, No signs of Nausea or vomiting and Pain level controlled  Post-op Vital Signs: Reviewed and stable  Complications: No apparent anesthesia complications  Elbony Mcclimans E 8:33 AM

## 2014-07-11 ENCOUNTER — Telehealth: Payer: Self-pay | Admitting: *Deleted

## 2014-07-11 NOTE — Telephone Encounter (Signed)
No answer, message left for the patient. 

## 2014-07-18 ENCOUNTER — Encounter: Payer: Self-pay | Admitting: Internal Medicine

## 2014-07-25 ENCOUNTER — Other Ambulatory Visit (INDEPENDENT_AMBULATORY_CARE_PROVIDER_SITE_OTHER): Payer: BLUE CROSS/BLUE SHIELD

## 2014-07-25 DIAGNOSIS — E119 Type 2 diabetes mellitus without complications: Secondary | ICD-10-CM

## 2014-07-25 DIAGNOSIS — E291 Testicular hypofunction: Secondary | ICD-10-CM

## 2014-07-25 DIAGNOSIS — E782 Mixed hyperlipidemia: Secondary | ICD-10-CM

## 2014-07-25 LAB — COMPREHENSIVE METABOLIC PANEL
ALBUMIN: 4.6 g/dL (ref 3.5–5.2)
ALT: 39 U/L (ref 0–53)
AST: 26 U/L (ref 0–37)
Alkaline Phosphatase: 69 U/L (ref 39–117)
BILIRUBIN TOTAL: 0.7 mg/dL (ref 0.2–1.2)
BUN: 15 mg/dL (ref 6–23)
CO2: 24 mEq/L (ref 19–32)
Calcium: 9.6 mg/dL (ref 8.4–10.5)
Chloride: 102 mEq/L (ref 96–112)
Creatinine, Ser: 0.89 mg/dL (ref 0.40–1.50)
GFR: 91.54 mL/min (ref >60.00)
Glucose, Bld: 137 mg/dL — ABNORMAL HIGH (ref 70–99)
Potassium: 4.2 mEq/L (ref 3.5–5.1)
Sodium: 139 mEq/L (ref 135–145)
Total Protein: 7.2 g/dL (ref 6.0–8.3)

## 2014-07-25 LAB — HEMOGLOBIN A1C: Hgb A1c MFr Bld: 7.6 % — ABNORMAL HIGH (ref 4.6–6.5)

## 2014-07-25 LAB — LIPID PANEL
CHOL/HDL RATIO: 4
Cholesterol: 154 mg/dL (ref 0–200)
HDL: 43.4 mg/dL (ref >39.00)
LDL Cholesterol: 75 mg/dL (ref 0–99)
NONHDL: 110.6
Triglycerides: 178 mg/dL — ABNORMAL HIGH (ref 0.0–149.0)
VLDL: 35.6 mg/dL (ref 0.0–40.0)

## 2014-07-25 LAB — LUTEINIZING HORMONE: LH: 4.54 m[IU]/mL (ref 1.50–9.30)

## 2014-07-26 LAB — PROLACTIN: Prolactin: 9.1 ng/mL (ref 4.0–15.2)

## 2014-07-26 LAB — LIPOPROTEIN ANALYSIS BY NMR
HDL Particle Number: 38.1 umol/L (ref >=30.5)
LDL PARTICLE NUMBER: 1359 nmol/L — AB (ref <1000)
LDL SIZE: 20.1 nm (ref >20.5)
LP-IR Score: 89 — ABNORMAL HIGH (ref <=45)
Small LDL Particle Number: 888 nmol/L — ABNORMAL HIGH (ref <=527)

## 2014-08-08 ENCOUNTER — Other Ambulatory Visit: Payer: Self-pay | Admitting: *Deleted

## 2014-08-08 ENCOUNTER — Telehealth: Payer: Self-pay | Admitting: Endocrinology

## 2014-08-08 MED ORDER — ROSUVASTATIN CALCIUM 10 MG PO TABS: 10.0000 mg | ORAL_TABLET | Freq: Every day | ORAL | Status: DC

## 2014-08-08 NOTE — Telephone Encounter (Signed)
I need to get copies of labs done by urologist.  Since the testosterone level has not been checked since stopping the medication will need to have him come in to the free and total testosterone level any morning early next month before starting prescription again

## 2014-08-08 NOTE — Telephone Encounter (Signed)
Patient called and would like for Suanne Marker to call him   Also, He wanted to give Suanne Marker the fax number to [facility] [phone]  Please fax Crestor 10mg    Thank you

## 2014-08-08 NOTE — Telephone Encounter (Signed)
Patient said at his last visit he asked if you would take over his hypogonadism care.  He was on Fortesta 2 pumps on each leg daily,  He stopped it when you ordered the labs to check his hormone levels and he hasn't started back on it. He wants to know if you are going to treat him for this?  Please advise.

## 2014-08-09 NOTE — Telephone Encounter (Signed)
Detailed message left on patient's vm.

## 2014-09-08 ENCOUNTER — Other Ambulatory Visit: Payer: Self-pay | Admitting: Endocrinology

## 2014-09-11 ENCOUNTER — Telehealth: Payer: Self-pay | Admitting: Endocrinology

## 2014-09-11 MED ORDER — LIRAGLUTIDE 18 MG/3ML ~~LOC~~ SOPN: PEN_INJECTOR | SUBCUTANEOUS | Status: DC

## 2014-09-11 NOTE — Telephone Encounter (Signed)
Patients wife called stating there has been a delay with [facility] for his insulin  Can you please send an Rx for a single pen to their local pharmacy ?  Pharmacy: [facility] [phone]  Thank you

## 2014-09-11 NOTE — Telephone Encounter (Signed)
Medication sent to the pharmacy.  Pt's wife notified.  DPR on file.  Pt should return in April to see Dr. Dwyane Dee.

## 2014-10-07 ENCOUNTER — Other Ambulatory Visit (INDEPENDENT_AMBULATORY_CARE_PROVIDER_SITE_OTHER): Payer: BLUE CROSS/BLUE SHIELD

## 2014-10-07 DIAGNOSIS — E1165 Type 2 diabetes mellitus with hyperglycemia: Secondary | ICD-10-CM

## 2014-10-07 DIAGNOSIS — IMO0002 Reserved for concepts with insufficient information to code with codable children: Secondary | ICD-10-CM

## 2014-10-07 LAB — BASIC METABOLIC PANEL
BUN: 17 mg/dL (ref 6–23)
CALCIUM: 9.5 mg/dL (ref 8.4–10.5)
CHLORIDE: 99 meq/L (ref 96–112)
CO2: 26 mEq/L (ref 19–32)
Creatinine, Ser: 0.91 mg/dL (ref 0.40–1.50)
GFR: 89.16 mL/min (ref >60.00)
Glucose, Bld: 100 mg/dL — ABNORMAL HIGH (ref 70–99)
Potassium: 4.5 mEq/L (ref 3.5–5.1)
Sodium: 136 mEq/L (ref 135–145)

## 2014-10-07 LAB — HEMOGLOBIN A1C: Hgb A1c MFr Bld: 6.9 % — ABNORMAL HIGH (ref 4.6–6.5)

## 2014-10-09 ENCOUNTER — Other Ambulatory Visit: Payer: BLUE CROSS/BLUE SHIELD

## 2014-10-09 DIAGNOSIS — E291 Testicular hypofunction: Secondary | ICD-10-CM

## 2014-10-10 ENCOUNTER — Ambulatory Visit (INDEPENDENT_AMBULATORY_CARE_PROVIDER_SITE_OTHER): Payer: BLUE CROSS/BLUE SHIELD | Admitting: Endocrinology

## 2014-10-10 ENCOUNTER — Telehealth: Payer: Self-pay | Admitting: Endocrinology

## 2014-10-10 VITALS — BP 130/78 | HR 92 | Temp 97.8°F | Ht 72.0 in | Wt 224.4 lb

## 2014-10-10 DIAGNOSIS — E782 Mixed hyperlipidemia: Secondary | ICD-10-CM | POA: Diagnosis not present

## 2014-10-10 DIAGNOSIS — E291 Testicular hypofunction: Secondary | ICD-10-CM

## 2014-10-10 DIAGNOSIS — E1165 Type 2 diabetes mellitus with hyperglycemia: Secondary | ICD-10-CM | POA: Diagnosis not present

## 2014-10-10 DIAGNOSIS — IMO0002 Reserved for concepts with insufficient information to code with codable children: Secondary | ICD-10-CM

## 2014-10-10 NOTE — Patient Instructions (Addendum)
Please check blood sugars at least half the time about 2 hours after any meal and 3 times per week on waking up. Please bring blood sugar monitor to each visit. Recommended blood sugar levels about 2 hours after meal is 140-180 and on waking up 90-130  Check coverage of Livalo otherwise alternate 5 and 10mg  Crestor

## 2014-10-10 NOTE — Telephone Encounter (Signed)
Patient need refill of Livalo

## 2014-10-10 NOTE — Progress Notes (Signed)
Patient ID: [name] [name], male   DOB: 12/21/1950, 64 y.o.   MRN: [id number]   Reason for Appointment: Diabetes follow-up   History of Present Illness   DIAGNOSIS: Made in 2010, Type 2 diabetes mellitus.    [name] [name] previously was on a regimen of glipizide 5 mg , Januvia and  Invokana.  [name] [name] had been restarted on Victoza in  4/15 when [name] [name] was having a tendency to weight gain and not controlling his portions. With gradually increasing the dose [name] [name] was tolerating 1.8 mg without difficulty  Recent history:  Although [name] [name] had not benefited much from his diabetes control with Invokana [name] [name] had lost some weight with this and the dose was increased in 02/2014 A1c has  improved since his last visit and [name] [name] has been trying to check his blood sugars more consistently after meals and watch his diet [name] [name] thinks [name] [name] has sporadic high readings after meals because of eating more carbohydrate Also trying to be fairly consistent with exercise using the exercise bike Side effects from medications: None.   Glucose monitoring:   Glucometer: One Touch.               Blood Glucose readings:   PRE-MEAL Breakfast Lunch Dinner Bedtime Overall  Glucose range:  89-117       Mean/median:      132    POST-MEAL PC Breakfast PC Lunch PC Dinner  Glucose range:   143-176   121-183   Mean/median:    151     Hypoglycemia: None but [name] [name] will get a headache when the blood sugars 80-90  Meals: content is variable, has been better with choices recently.  Less high fat meals  [name] [name] knows that high carbohydrate foods will raise his blood sugar.   Physical activity: exercise: Exercise bike or biking outside  Diabetes Educator visit: Most recent: 3/13 .  Dietician visit: Most recent: 09/2011. Following recommendations were made then: 1. [name] [name] will add carbohydrates back in to his diet and control them to 30-75 grams per meal. 2. Patient will limit his meat portions to 4-6 oz. at supper. 3. Patient will cut his use of added fat and fried foods  in half. 4. Patient will limit snacks to 200 calories or less at night..   Peripheral neuropathy: None, has no numbness or tingling in his feet .  Diabetic nephropathy:  none, Microalbumin ratio: normal   Wt Readings from Last 3 Encounters:  10/10/14 224 lb 6 oz (101.776 kg)  07/10/14 227 lb (102.967 kg)  07/02/14 225 lb 3.2 oz (102.15 kg)   Lab Results  Component Value Date   HGBA1C 6.9* 10/07/2014   HGBA1C 7.6* 07/25/2014   HGBA1C 6.9* 06/26/2014   Lab Results  Component Value Date   MICROALBUR 1.0 01/17/2014   LDLCALC 75 07/25/2014   CREATININE 0.91 10/07/2014    Hyperlipidemia:   Patient is on low-dose Crestor 10 and 1000 mg niacin also. No history of CAD.  LDL particle number initially was 1632 . LDL particle number = 1095 in 12/14 at the lipid clinic although particle size still relatively low at 20.3 Other  labs on 06/11/13: LDL = 80, HDL = 50 and triglycerides 117  Recently:  Lab Results  Component Value Date   CHOL 154 07/25/2014   HDL 43.40 07/25/2014   LDLCALC 75 07/25/2014   LDLDIRECT 65.8 03/28/2014   TRIG 178.0* 07/25/2014   CHOLHDL 4 07/25/2014  Medication List       This list is accurate as of: 10/10/14  8:05 AM.  Always use your most recent med list.               amLODipine-benazepril 10-20 MG per capsule  Commonly known as:  LOTREL  Take 10-20 capsules by mouth daily.     aspirin 81 MG tablet  Take 81 mg by mouth daily.     ASTEPRO 0.15 % Soln  Generic drug:  Azelastine HCl  Place into the nose as needed.     canagliflozin 300 MG Tabs tablet  Commonly known as:  INVOKANA  Take 1 tablet daily     fexofenadine 30 MG tablet  Commonly known as:  ALLEGRA  Take 180 mg by mouth 2 (two) times daily.     fluticasone 50 MCG/ACT nasal spray  Commonly known as:  FLONASE     glipiZIDE 5 MG 24 hr tablet  Commonly known as:  GLUCOTROL XL  Take 1 tablet (5 mg total) by mouth daily with  breakfast.     Insulin Pen Needle 31G X 5 MM Misc  Use to inject victoza     Liraglutide 18 MG/3ML Sopn  Commonly known as:  VICTOZA  INJECT 1.8 MG UNDER THE SKIN DAILY     Melatonin 5 MG Tabs  Take 1 tablet by mouth at bedtime as needed and may repeat dose one time if needed.     multivitamin tablet  Take 1 tablet by mouth 2 (two) times daily.     niacin 500 MG tablet  Take 1,000 mg by mouth daily with breakfast.     ONE TOUCH ULTRA TEST test strip  Generic drug:  glucose blood     OSTEO BI-FLEX ADV DOUBLE ST PO  Take by mouth 2 (two) times daily.     PROBIOTIC PO  Take by mouth daily.     rosuvastatin 10 MG tablet  Commonly known as:  CRESTOR  Take 1 tablet (10 mg total) by mouth daily.     Vitamin D3 3000 UNITS Tabs  Take 1 tablet by mouth daily.        Allergies:  Allergies  Allergen Reactions  . Metformin Diarrhea    Severe diarrhea  . Sulfonamide Derivatives     REACTION: Hives  . Sulfa Antibiotics Rash    Past Medical History  Diagnosis Date  . Depression   . Mild obesity   . Chronic gouty arthritis   . Hypertension   . Dysphagia, unspecified(787.20)   . Esophageal stricture   . Hiatal hernia with gastroesophageal reflux   . Diverticulosis of colon   . Family history of colon cancer   . Allergy     seasonal  . Asthma   . Diabetes mellitus without complication   . Hyperlipidemia   . Sleep apnea     cpap    Past Surgical History  Procedure Laterality Date  . Ankle surgery    . Colonoscopy    . Nasal septum surgery    . Umbilical hernia repair    . Esophagogastroduodenoscopy      Family History  Problem Relation Age of Onset  . Colon cancer Father     onset at age 25  . Heart disease Father   . Diabetes Mother   . Heart disease Mother     Social History:  reports that [name] [name] quit smoking about 36 years ago. His smoking use included Cigarettes. [name] [name] has a 3  pack-year smoking history. [name] [name] has never used smokeless tobacco. [name] [name] reports that  [name] [name] drinks alcohol. [name] [name] reports that [name] [name] does not use illicit drugs.  Review of Systems -  HYPOGONADISM: [name] [name] thinks this was diagnosed about 3 or so years ago by his PCP but no details are available [name] [name] says [name] [name] was having symptoms of fatigue and depression at the onset and this had not improved with taking testosterone gel [name] [name] had been on and off testosterone supplements since then from his urologist Diagnosis of hypogonadism made by urologist T=212 Subsequent testosterone levels were still in the 200+ range   Since [name] [name] has been off the testosterone supplements [name] [name] has not been feeling any more fatigued Also  his PSA from the primary care physician is 4.4    [name] [name] is on CPAP for sleep apnea  No history of neuropathy  Foot exam was done in 7/15  BP checked periodically at home, [name] [name] is on Lotrel from his PCP.  Not clear if [name] [name] is taking HCTZ     Examination:   BP 130/78 mmHg  Pulse 92  Temp(Src) 97.8 F (36.6 C) (Oral)  Ht 6' (1.829 m)  Wt 224 lb 6 oz (101.776 kg)  BMI 30.42 kg/m2  SpO2 94%  Body mass index is 30.42 kg/(m^2).   No ankle edema  Assesment:   Diabetes type 2:   The patient's diabetes control appears to be fair with A1c  just under 7% As discussed in history of present illness [name] [name] tends to have high postprandial readings periodically Most of this is related to increased carbohydrate intake.  However his average glucose after meals at night is about 150 international units [name] [name] has been taking Victoza 1.8 mg along with glipizide ER and Invokana 300 mg [name] [name] is trying to get exercise about 3 times a week and encouraged him to work further on weight loss  [name] [name] is complaining about nocturia from [facility] and [name] [name] can try taking a half a tablet for now.  May be able to try 100 mg or even switch to Jardiance 10 mg if covered. Also consider changing glipizide to Amaryl 2 mg in the morning to help postprandial readings during the day Will review his A1c again in 3 months  HYPOGONADISM:  His baseline level was 212 and [name] [name] has not had any symptoms of hypogonadism with stopping the supplements since his last visit. 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 Discussed that [name] [name] may have had improved testosterone secretion after weight loss over the last 2-4 years and improved lifestyle  LIPIDS: Because of high particle number his Crestor and niacin have been increased but [name] [name] is not tolerating 10 mg Crestor well because of muscle aches [name] [name] will check on the coverage for LIVALO otherwise will try 10 mg alternating with 5 mg daily Co-pay card for Livalo given  May have better particle number with increasing niacin to 1500 mg Has history of high particle number and this will be rechecked on the next visit Again discussed improved lifestyle with low saturated fat diet  HYPERTENSION: Treated by PCP, blood pressure is better and [name] [name] can continue Lotrel  Total visit time reviewing multiple medical problems, labs, blood sugar download, previous records and counseling = 25 minutes    Shrinika Blatz 10/10/2014, 8:05 AM

## 2014-10-10 NOTE — Progress Notes (Signed)
Pre visit review using our clinic review tool, if applicable. No additional management support is needed unless otherwise documented below in the visit note. 

## 2014-10-11 NOTE — Telephone Encounter (Signed)
livalo needs to be addressed and call in to replace crestor today he has no more

## 2014-10-14 ENCOUNTER — Other Ambulatory Visit: Payer: Self-pay | Admitting: *Deleted

## 2014-10-14 ENCOUNTER — Telehealth: Payer: Self-pay | Admitting: *Deleted

## 2014-10-14 LAB — TESTOSTERONE, FREE, TOTAL, SHBG
Sex Hormone Binding: 21.8 nmol/L (ref 19.3–76.4)
TESTOSTERONE FREE: 4.5 pg/mL — AB (ref 6.6–18.1)
Testosterone: 261 ng/dL — ABNORMAL LOW (ref 348–1197)

## 2014-10-14 MED ORDER — ROSUVASTATIN CALCIUM 10 MG PO TABS: 10.0000 mg | ORAL_TABLET | Freq: Every day | ORAL | Status: AC

## 2014-10-14 NOTE — Telephone Encounter (Signed)
Noted, patient is aware, rx sent 

## 2014-10-14 NOTE — Telephone Encounter (Signed)
Stay on Crestor 10 mg alternating with 5 mg daily

## 2014-10-14 NOTE — Telephone Encounter (Signed)
Patient called, he said his insurance will cover the Livalo, please advise what dose you want sent in.

## 2014-11-01 ENCOUNTER — Encounter: Payer: Self-pay | Admitting: Gastroenterology

## 2015-01-06 ENCOUNTER — Other Ambulatory Visit (INDEPENDENT_AMBULATORY_CARE_PROVIDER_SITE_OTHER): Payer: BLUE CROSS/BLUE SHIELD

## 2015-01-06 DIAGNOSIS — E1165 Type 2 diabetes mellitus with hyperglycemia: Secondary | ICD-10-CM | POA: Diagnosis not present

## 2015-01-06 DIAGNOSIS — E782 Mixed hyperlipidemia: Secondary | ICD-10-CM | POA: Diagnosis not present

## 2015-01-06 DIAGNOSIS — IMO0002 Reserved for concepts with insufficient information to code with codable children: Secondary | ICD-10-CM

## 2015-01-06 LAB — MICROALBUMIN / CREATININE URINE RATIO
CREATININE, U: 56.9 mg/dL
MICROALB/CREAT RATIO: 1.2 mg/g (ref 0.0–30.0)
Microalb, Ur: <0.7 mg/dL (ref 0.0–1.9)

## 2015-01-06 LAB — COMPREHENSIVE METABOLIC PANEL
ALBUMIN: 4.2 g/dL (ref 3.5–5.2)
ALK PHOS: 83 U/L (ref 39–117)
ALT: 36 U/L (ref 0–53)
AST: 29 U/L (ref 0–37)
BUN: 12 mg/dL (ref 6–23)
CO2: 27 meq/L (ref 19–32)
CREATININE: 0.91 mg/dL (ref 0.40–1.50)
Calcium: 9.2 mg/dL (ref 8.4–10.5)
Chloride: 103 mEq/L (ref 96–112)
GFR: 89.09 mL/min (ref >60.00)
Glucose, Bld: 130 mg/dL — ABNORMAL HIGH (ref 70–99)
Potassium: 4.5 mEq/L (ref 3.5–5.1)
Sodium: 137 mEq/L (ref 135–145)
Total Bilirubin: 0.5 mg/dL (ref 0.2–1.2)
Total Protein: 6.9 g/dL (ref 6.0–8.3)

## 2015-01-06 LAB — LIPID PANEL
Cholesterol: 131 mg/dL (ref 0–200)
HDL: 44.6 mg/dL (ref >39.00)
LDL Cholesterol: 71 mg/dL (ref 0–99)
NONHDL: 86.4
TRIGLYCERIDES: 77 mg/dL (ref 0.0–149.0)
Total CHOL/HDL Ratio: 3
VLDL: 15.4 mg/dL (ref 0.0–40.0)

## 2015-01-06 LAB — POCT URINALYSIS DIPSTICK
Bilirubin, UA: NEGATIVE
Blood, UA: NEGATIVE
GLUCOSE UA: 2000
Ketones, UA: NEGATIVE
LEUKOCYTES UA: NEGATIVE
Nitrite, UA: NEGATIVE
PH UA: 5
PROTEIN UA: NEGATIVE
SPEC GRAV UA: 1.01
UROBILINOGEN UA: 0.2

## 2015-01-09 ENCOUNTER — Ambulatory Visit (INDEPENDENT_AMBULATORY_CARE_PROVIDER_SITE_OTHER): Payer: BLUE CROSS/BLUE SHIELD | Admitting: Endocrinology

## 2015-01-09 ENCOUNTER — Encounter: Payer: Self-pay | Admitting: Endocrinology

## 2015-01-09 ENCOUNTER — Other Ambulatory Visit: Payer: Self-pay | Admitting: *Deleted

## 2015-01-09 VITALS — BP 132/86 | HR 72 | Temp 98.0°F | Resp 16 | Ht 72.0 in | Wt 223.4 lb

## 2015-01-09 DIAGNOSIS — E782 Mixed hyperlipidemia: Secondary | ICD-10-CM

## 2015-01-09 DIAGNOSIS — E119 Type 2 diabetes mellitus without complications: Secondary | ICD-10-CM | POA: Diagnosis not present

## 2015-01-09 DIAGNOSIS — I10 Essential (primary) hypertension: Secondary | ICD-10-CM | POA: Diagnosis not present

## 2015-01-09 DIAGNOSIS — E291 Testicular hypofunction: Secondary | ICD-10-CM

## 2015-01-09 MED ORDER — GLIPIZIDE ER 5 MG PO TB24: 5.0000 mg | ORAL_TABLET | Freq: Every day | ORAL | Status: DC

## 2015-01-09 MED ORDER — INSULIN PEN NEEDLE 31G X 5 MM MISC: Status: DC

## 2015-01-09 MED ORDER — ONETOUCH ULTRASOFT LANCETS MISC: Status: DC

## 2015-01-09 MED ORDER — EMPAGLIFLOZIN 10 MG PO TABS: 10.0000 mg | ORAL_TABLET | Freq: Every day | ORAL | Status: DC

## 2015-01-09 MED ORDER — LIRAGLUTIDE 18 MG/3ML ~~LOC~~ SOPN: PEN_INJECTOR | SUBCUTANEOUS | Status: DC

## 2015-01-09 NOTE — Patient Instructions (Signed)
Check dose of Niacin  Keep up the diet/exercise

## 2015-01-09 NOTE — Progress Notes (Signed)
Patient ID: [name] [name], male   DOB: May 12, 1951, 64 y.o.   MRN: [id number]   Reason for Appointment: Diabetes follow-up   History of Present Illness   DIAGNOSIS: Made in 2010, Type 2 diabetes mellitus.    He previously was on a regimen of glipizide 5 mg , Januvia and  Invokana.  He had been restarted on Victoza in  4/15 when he was having a tendency to weight gain and not controlling his portions. With gradually increasing the dose he was tolerating 1.8 mg without difficulty  Recent history:   He has had fairly good control of his diabetes with A1c under 7% now consistently This is with a multidrug regimen including Victoza, Invokana, glipizide ER Although he had not benefited much from his diabetes control with Invokana he had lost some weight with this and the dose was increased in 02/2014 He was complaining of excessive urination including at night and he is not taking half a tablet up to 300 mg but still goes to the bathroom at night about 4 times  He generally watching his diet although has not lost any weight Also trying to be fairly consistent with exercise using the exercise bike  Side effects from medications: None.   Glucose monitoring: Every other day or so  Glucometer:              Blood Glucose readings:   Mean values apply above for all meters except median for One Touch  PRE-MEAL Fasting Lunch Dinner Bedtime Overall  Glucose range:  101, 103   117   143   130-178    Mean/median:      120    Hypoglycemia: None but he will get a headache when the blood sugars 80-90  Meals: content is variable, has been better with choices.  Less high fat meals  He knows that high carbohydrate foods will raise his blood sugar.   Physical activity: exercise: Exercise bike or biking outside 3-4/7 Diabetes Educator visit: Most recent: 3/13 .  Dietician visit: Most recent: 09/2011.   Wt Readings from Last 3 Encounters:  01/09/15 223 lb 6.4 oz (101.334 kg)  10/10/14  224 lb 6 oz (101.776 kg)  07/10/14 227 lb (102.967 kg)   Lab Results  Component Value Date   HGBA1C 6.9* 10/07/2014   HGBA1C 7.6* 07/25/2014   HGBA1C 6.9* 06/26/2014   Lab Results  Component Value Date   MICROALBUR <0.7 01/06/2015   LDLCALC 71 01/06/2015   CREATININE 0.91 01/06/2015    Hyperlipidemia:   Patient is on low-dose Crestor 10, 3 times a week and 1000 mg niacin also. No history of CAD.  He was advised to take Livalo which she could take every day but he thinks that he is not having any muscle aches with Crestor now and point to continue  LDL particle number initially was 1632 . LDL particle number = 1095 in 12/14 at the lipid clinic although particle size still relatively low at 20.3 Most recent LDL particle number was 1359, he was asked to increase his niacin to 1500 mg but is not sure if he has done so  Other  labs on 06/11/13: LDL = 80, HDL = 50 and triglycerides 117   Recently:  Lab Results  Component Value Date   CHOL 131 01/06/2015   HDL 44.60 01/06/2015   LDLCALC 71 01/06/2015   LDLDIRECT 65.8 03/28/2014   TRIG 77.0 01/06/2015   CHOLHDL 3 01/06/2015  Medication List       This list is accurate as of: 01/09/15 11:17 AM.  Always use your most recent med list.               amLODipine-benazepril 10-20 MG per capsule  Commonly known as:  LOTREL  Take 10-20 capsules by mouth daily.     aspirin 81 MG tablet  Take 81 mg by mouth daily.     ASTEPRO 0.15 % Soln  Generic drug:  Azelastine HCl  Place into the nose as needed.     empagliflozin 10 MG Tabs tablet  Commonly known as:  JARDIANCE  Take 10 mg by mouth daily.     fexofenadine 30 MG tablet  Commonly known as:  ALLEGRA  Take 180 mg by mouth 2 (two) times daily.     fluticasone 50 MCG/ACT nasal spray  Commonly known as:  FLONASE     glipiZIDE 5 MG 24 hr tablet  Commonly known as:  GLUCOTROL XL  Take 1 tablet (5 mg total) by mouth daily  with breakfast.     Insulin Pen Needle 31G X 5 MM Misc  Use to inject victoza     Liraglutide 18 MG/3ML Sopn  Commonly known as:  VICTOZA  INJECT 1.8 MG UNDER THE SKIN DAILY     Melatonin 5 MG Tabs  Take 1 tablet by mouth at bedtime as needed and may repeat dose one time if needed.     multivitamin tablet  Take 1 tablet by mouth 2 (two) times daily.     niacin 500 MG tablet  Take 1,000 mg by mouth daily with breakfast.     ONE TOUCH ULTRA TEST test strip  Generic drug:  glucose blood     onetouch ultrasoft lancets  Use as instructed     OSTEO BI-FLEX ADV DOUBLE ST PO  Take by mouth 2 (two) times daily.     PROBIOTIC PO  Take by mouth daily.     rosuvastatin 10 MG tablet  Commonly known as:  CRESTOR  Take 1 tablet (10 mg total) by mouth daily.     Vitamin D3 3000 UNITS Tabs  Take 1 tablet by mouth daily.        Allergies:  Allergies  Allergen Reactions  . Metformin Diarrhea    Severe diarrhea  . Sulfonamide Derivatives     REACTION: Hives  . Sulfa Antibiotics Rash    Past Medical History  Diagnosis Date  . Depression   . Mild obesity   . Chronic gouty arthritis   . Hypertension   . Dysphagia, unspecified(787.20)   . Esophageal stricture   . Hiatal hernia with gastroesophageal reflux   . Diverticulosis of colon   . Family history of colon cancer   . Allergy     seasonal  . Asthma   . Diabetes mellitus without complication   . Hyperlipidemia   . Sleep apnea     cpap    Past Surgical History  Procedure Laterality Date  . Ankle surgery    . Colonoscopy    . Nasal septum surgery    . Umbilical hernia repair    . Esophagogastroduodenoscopy      Family History  Problem Relation Age of Onset  . Colon cancer Father     onset at age 60  . Heart disease Father   . Diabetes Mother   . Heart disease Mother     Social History:  reports that he quit smoking about  36 years ago. His smoking use included Cigarettes. He has a 3 pack-year smoking  history. He has never used smokeless tobacco. He reports that he drinks alcohol. He reports that he does not use illicit drugs.  Review of Systems -  HYPOGONADISM: He thinks this was diagnosed about 3 or so years ago by his PCP but no details are available He says he was having symptoms of fatigue and depression at the onset and this had not improved with taking testosterone gel He had been on and off testosterone supplements since then from his urologist Diagnosis of hypogonadism made by urologist T=212 Subsequent testosterone levels were still in the 200+ range  Since he has been off the testosterone supplements he has not been feeling any more fatigued Also his PSA from the primary care physician in 12/15 was 4.4, unchanged from 2014 Recent free testosterone level is low    He is on CPAP for sleep apnea  Foot exam was done in 7/15 Eye exam 9/15  HYPERTENSION: BP checked periodically at home, he is on Lotrel from his PCP.  Recent readings about 135/78-80   Examination:   BP 132/86 mmHg  Pulse 72  Temp(Src) 98 F (36.7 C)  Resp 16  Ht 6' (1.829 m)  Wt 223 lb 6.4 oz (101.334 kg)  BMI 30.29 kg/m2  SpO2 96%  Body mass index is 30.29 kg/(m^2).   No ankle edema  Assesment:   Diabetes type 2:   The patient's diabetes control appears to be good with A1c consistently under 7% Although he has done well with multidrug regimen he is concerned about the polyuria he is having with Invokana, even with 150 mg He has been doing fairly well with diet and exercise and more committed to doing these Home blood sugars are also fairly good  He wants to try getting off Invokana because of his nocturia and will give him a one-month trial of Jardiance 10 mg daily  HYPOGONADISM: He has hypogonadotropic hypogonadism His free testosterone is relatively low but he is asymptomatic currently Also because of his mildly increased PSA will hold off on testosterone supplements for now He will call if  he has any increasing fatigue or decreased libido We'll check his testosterone again on his next visit Needs to continue efforts to lose weight   LIPIDS: His lipid panel looks good with 10 mg 3 times a week of Crestor Not clear how much niacin is taking Needs to have another lipid particle estimation on his next visit  HYPERTENSION: Treated by PCP, blood pressure is controlled and he can continue Lotrel    Leslee Suire 01/09/2015, 11:17 AM

## 2015-01-16 ENCOUNTER — Other Ambulatory Visit: Payer: Self-pay | Admitting: Endocrinology

## 2015-02-17 ENCOUNTER — Other Ambulatory Visit: Payer: Self-pay | Admitting: Endocrinology

## 2015-02-17 ENCOUNTER — Other Ambulatory Visit: Payer: Self-pay

## 2015-02-17 ENCOUNTER — Telehealth: Payer: Self-pay | Admitting: Endocrinology

## 2015-02-17 MED ORDER — INSULIN PEN NEEDLE 31G X 6 MM MISC
Status: DC
Start: 2015-02-17 — End: 2015-05-19

## 2015-02-17 MED ORDER — INSULIN PEN NEEDLE 31G X 6 MM MISC: Status: DC

## 2015-02-17 NOTE — Telephone Encounter (Signed)
Patient need syringes for medication insulin (Novolin) 31 gauge 6 mm long [facility] [place]

## 2015-02-18 ENCOUNTER — Other Ambulatory Visit: Payer: Self-pay | Admitting: *Deleted

## 2015-02-18 MED ORDER — INSULIN SYRINGE-NEEDLE U-100 31G X 5/16" 1 ML MISC
Status: DC
Start: 2015-02-18 — End: 2015-05-19

## 2015-03-04 ENCOUNTER — Ambulatory Visit: Payer: BC Managed Care – PPO | Admitting: Pulmonary Disease

## 2015-03-20 ENCOUNTER — Ambulatory Visit (INDEPENDENT_AMBULATORY_CARE_PROVIDER_SITE_OTHER): Payer: BLUE CROSS/BLUE SHIELD | Admitting: Pulmonary Disease

## 2015-03-20 ENCOUNTER — Encounter: Payer: Self-pay | Admitting: Pulmonary Disease

## 2015-03-20 VITALS — BP 140/90 | HR 90 | Ht 72.0 in | Wt 225.4 lb

## 2015-03-20 DIAGNOSIS — G4733 Obstructive sleep apnea (adult) (pediatric): Secondary | ICD-10-CM | POA: Diagnosis not present

## 2015-03-20 DIAGNOSIS — G4700 Insomnia, unspecified: Secondary | ICD-10-CM | POA: Diagnosis not present

## 2015-03-20 DIAGNOSIS — Z9989 Dependence on other enabling machines and devices: Principal | ICD-10-CM

## 2015-03-20 NOTE — Progress Notes (Signed)
Chief Complaint  Patient presents with  . Sleep Apnea    Former [place] pt. pt states he is doing well, he just has a hard time staying asleep at night. pt using CPAP every night for about 5 hours a night. pressure and mask good for pt: DME: [facility].    History of Present Illness: [name] [name] is a 64 y.o. male with OSA.  He was previously seen by Dr. [name].  He is able to fall asleep easily.  He goes to bed between 10 and 11 pm.  He has been using OTC sleep aide >> not much help.  He will wake up after 3 to 4 hours, and then doesn't feel like he can fall back to sleep.  He feels like he only gets 1 or 2 good nights of sleep per week.  He avoids TV at bedtime, and does not drink coffee after the morning.  He exercises in the morning.  He will lay in bed until time to get up if he can't fall back to sleep.  He avoids taking naps.   TESTS: PSG 07/03/01 >> AHI 49, SpO2 low 81% Auto CPAP 02/12/15 to 03/13/15 >> used on 30 of 30 nights with average 6 hrs and 59 min.  Average AHI is 0.4 with median CPAP 7 cm H2O and 95 th percentile CPAP 11 cm H20.   PMhx >> Depression, Gout, HTN, GERD, Asthma, DM, HLD  Past surgical hx, Medications, Allergies, Family hx, Social hx all reviewed.   Physical Exam: BP 140/90 mmHg  Pulse 90  Ht 6' (1.829 m)  Wt 225 lb 6.4 oz (102.241 kg)  BMI 30.56 kg/m2  SpO2 95%  General - No distress ENT - No sinus tenderness, no oral exudate, no LAN Cardiac - s1s2 regular, no murmur Chest - No wheeze/rales/dullness Back - No focal tenderness Abd - Soft, non-tender Ext - No edema Neuro - Normal strength Skin - No rashes Psych - normal mood, and behavior   Assessment/Plan:  Sleep maintenance insomnia. Plan: - advised him to maintain regular sleep/wake schedule - sleep hygiene reviewed - discussed stimulus control, relaxation techniques and sleep restriction - he has hx of depression >> he might need further assessment of this  Obstructive sleep  apnea. Plan: - continue auto CPAP - if his insomnia persists, then he might need in lab titration study to determine if his sleep apnea is contributing to his "insomnia" complaints   [name] Mires, MD [place] [facility]/Sleep Pager:  [phone]

## 2015-03-20 NOTE — Patient Instructions (Signed)
Follow up in 1 year.

## 2015-04-28 ENCOUNTER — Other Ambulatory Visit: Payer: Self-pay | Admitting: Endocrinology

## 2015-04-30 ENCOUNTER — Ambulatory Visit (INDEPENDENT_AMBULATORY_CARE_PROVIDER_SITE_OTHER): Payer: BLUE CROSS/BLUE SHIELD | Admitting: Pulmonary Disease

## 2015-04-30 ENCOUNTER — Ambulatory Visit (INDEPENDENT_AMBULATORY_CARE_PROVIDER_SITE_OTHER)
Admission: RE | Admit: 2015-04-30 | Discharge: 2015-04-30 | Disposition: A | Discharge status: Home / Self Care | Payer: BLUE CROSS/BLUE SHIELD | Source: Ambulatory Visit | Attending: Pulmonary Disease | Admitting: Pulmonary Disease

## 2015-04-30 ENCOUNTER — Encounter: Payer: Self-pay | Admitting: Pulmonary Disease

## 2015-04-30 VITALS — BP 150/84 | HR 83 | Ht 72.0 in | Wt 226.8 lb

## 2015-04-30 DIAGNOSIS — J984 Other disorders of lung: Secondary | ICD-10-CM

## 2015-04-30 DIAGNOSIS — R053 Chronic cough: Secondary | ICD-10-CM

## 2015-04-30 DIAGNOSIS — R05 Cough: Secondary | ICD-10-CM

## 2015-04-30 DIAGNOSIS — R059 Cough, unspecified: Secondary | ICD-10-CM

## 2015-04-30 MED ORDER — ALBUTEROL SULFATE 108 (90 BASE) MCG/ACT IN AEPB
2.0000 | INHALATION_SPRAY | Freq: Four times a day (QID) | RESPIRATORY_TRACT | Status: DC | PRN
Start: 2015-04-30 — End: 2015-10-28

## 2015-04-30 NOTE — Progress Notes (Signed)
Chief Complaint  Patient presents with  . Follow-up    Pt following for OSA:pt states something is irriatating his chest. pt c/o dry cough and ots been a couple of months. pt states he tries to cough something up and it doesnt come up.  Pt using CPAP everynight for about 4 - 6 hours. pt states he has trouble going back to sleep once he wakes. DME: [facility].     History of Present Illness: [name] [name] is a 64 y.o. male former smoker with OSA.  He had mulch delivered to his home a month ago.  He developed sinus congestion, ear congestion, and sore throat.  He was seen by his PCP.  Strep swab was negative.  His throat and sinus symptoms improved.  He is still having a dry cough.  He feels like there is an irritation in his chest.  It feels cold, and like a feather is tickling in his chest.  His wife had similar symptoms, but is better.  He denies fever, sweats, sputum, wheeze, or hemoptysis.  He has hx of allergies, but feels these symptoms are controlled with flonase, astepro, and allegra.  He had asthma as a child, but not an issue as an adult.  His reflux seems stable.  He has been on lotrel for years, and no other changes to his medications recently.  TESTS: PSG 07/03/01 >> AHI 49, SpO2 low 81% Auto CPAP 02/12/15 to 03/13/15 >> used on 30 of 30 nights with average 6 hrs and 59 min.  Average AHI is 0.4 with median CPAP 7 cm H2O and 95 th percentile CPAP 11 cm H20.   PMhx >> Depression, Gout, HTN, GERD, Asthma, DM, HLD  Past surgical hx, Medications, Allergies, Family hx, Social hx all reviewed.   Physical Exam: BP 150/84 mmHg  Pulse 83  Ht 6' (1.829 m)  Wt 226 lb 12.8 oz (102.876 kg)  BMI 30.75 kg/m2  SpO2 94%  General - No distress ENT - No sinus tenderness, no oral exudate, no LAN Cardiac - s1s2 regular, no murmur Chest - No wheeze/rales/dullness Back - No focal tenderness Abd - Soft, non-tender Ext - No edema Neuro - Normal strength Skin - No rashes Psych - normal mood,  and behavior  Spirometry 04/30/15 >> FEV1 3.19 (83%), FVC 3.79 (76%), FEV1% 84  Assessment/Plan:  Persistent cough after exposure to mulch with hx of allergies, and GERD. He has mild restriction on spirometry today. Plan: - will give him trial of albuterol - will get CXR and arrange for full PFTs - depending on results, might need further chest imaging studies  Obstructive sleep apnea. Plan: - continue auto CPAP    Chesley Mires, MD [place]/Sleep Pager:  [phone]

## 2015-04-30 NOTE — Patient Instructions (Signed)
Proair 2 puffs every four to six hours as needed for cough, wheeze, or chest congestion Chest xray today Will schedule pulmonary function test Follow up in 2 to 3 weeks with Dr. Halford Chessman or [facility]

## 2015-05-02 ENCOUNTER — Telehealth: Payer: Self-pay | Admitting: *Deleted

## 2015-05-02 ENCOUNTER — Ambulatory Visit (INDEPENDENT_AMBULATORY_CARE_PROVIDER_SITE_OTHER): Payer: BLUE CROSS/BLUE SHIELD | Admitting: Pulmonary Disease

## 2015-05-02 DIAGNOSIS — R05 Cough: Secondary | ICD-10-CM

## 2015-05-02 DIAGNOSIS — J984 Other disorders of lung: Secondary | ICD-10-CM

## 2015-05-02 DIAGNOSIS — R053 Chronic cough: Secondary | ICD-10-CM

## 2015-05-02 LAB — PULMONARY FUNCTION TEST
DL/VA % pred: 112 %
DL/VA: 5.3 ml/min/mmHg/L
DLCO unc % pred: 102 %
DLCO unc: 35.85 ml/min/mmHg
FEF 25-75 Post: 3.51 L/sec
FEF 25-75 Pre: 3.51 L/sec
FEF2575-%Change-Post: 0 %
FEF2575-%Pred-Post: 119 %
FEF2575-%Pred-Pre: 119 %
FEV1-%CHANGE-POST: 0 %
FEV1-%PRED-PRE: 96 %
FEV1-%Pred-Post: 96 %
FEV1-POST: 3.59 L
FEV1-PRE: 3.58 L
FEV1FVC-%Change-Post: 3 %
FEV1FVC-%Pred-Pre: 106 %
FEV6-%Change-Post: -2 %
FEV6-%PRED-POST: 91 %
FEV6-%PRED-PRE: 93 %
FEV6-POST: 4.31 L
FEV6-PRE: 4.42 L
FEV6FVC-%CHANGE-POST: 0 %
FEV6FVC-%PRED-POST: 105 %
FEV6FVC-%PRED-PRE: 104 %
FVC-%Change-Post: -3 %
FVC-%PRED-POST: 86 %
FVC-%PRED-PRE: 89 %
FVC-POST: 4.31 L
FVC-Pre: 4.46 L
PRE FEV6/FVC RATIO: 99 %
Post FEV1/FVC ratio: 83 %
Post FEV6/FVC ratio: 100 %
Pre FEV1/FVC ratio: 80 %
RV % PRED: 115 %
RV: 2.82 L
TLC % PRED: 99 %
TLC: 7.35 L

## 2015-05-02 NOTE — Progress Notes (Signed)
PFT done today. 

## 2015-05-07 ENCOUNTER — Other Ambulatory Visit (INDEPENDENT_AMBULATORY_CARE_PROVIDER_SITE_OTHER): Payer: BLUE CROSS/BLUE SHIELD

## 2015-05-07 DIAGNOSIS — E291 Testicular hypofunction: Secondary | ICD-10-CM

## 2015-05-07 DIAGNOSIS — E119 Type 2 diabetes mellitus without complications: Secondary | ICD-10-CM | POA: Diagnosis not present

## 2015-05-07 DIAGNOSIS — E782 Mixed hyperlipidemia: Secondary | ICD-10-CM

## 2015-05-07 LAB — BASIC METABOLIC PANEL
BUN: 19 mg/dL (ref 6–23)
CHLORIDE: 102 meq/L (ref 96–112)
CO2: 28 meq/L (ref 19–32)
Calcium: 9.1 mg/dL (ref 8.4–10.5)
Creatinine, Ser: 0.86 mg/dL (ref 0.40–1.50)
GFR: 95 mL/min (ref >60.00)
GLUCOSE: 131 mg/dL — AB (ref 70–99)
POTASSIUM: 4.5 meq/L (ref 3.5–5.1)
SODIUM: 138 meq/L (ref 135–145)

## 2015-05-07 LAB — HEMOGLOBIN A1C: HEMOGLOBIN A1C: 7.1 % — AB (ref 4.6–6.5)

## 2015-05-09 LAB — LIPOPROTEIN ANALYSIS BY NMR
HDL PARTICLE NUMBER: 38.2 umol/L (ref >=30.5)
LDL PARTICLE NUMBER: 1125 nmol/L — AB (ref <1000)
LDL Size: 20 nm (ref >20.5)
LP-IR Score: 82 — ABNORMAL HIGH (ref <=45)
SMALL LDL PARTICLE NUMBER: 774 nmol/L — AB (ref <=527)

## 2015-05-09 LAB — TESTOSTERONE, FREE, TOTAL, SHBG
Sex Hormone Binding: 22.3 nmol/L (ref 19.3–76.4)
Testosterone, Free: 5.4 pg/mL — ABNORMAL LOW (ref 6.6–18.1)
Testosterone: 251 ng/dL — ABNORMAL LOW (ref 348–1197)

## 2015-05-12 ENCOUNTER — Telehealth: Payer: Self-pay | Admitting: Pulmonary Disease

## 2015-05-12 ENCOUNTER — Ambulatory Visit: Payer: BLUE CROSS/BLUE SHIELD | Admitting: Endocrinology

## 2015-05-12 NOTE — Telephone Encounter (Signed)
Dg Chest 2 View  04/30/2015  CLINICAL DATA:  Cough for 2 months.  History of pneumonia, asthma EXAM: CHEST  2 VIEW COMPARISON:  None. FINDINGS: The heart size and mediastinal contours are within normal limits. Both lungs are clear. The visualized skeletal structures are unremarkable. IMPRESSION: No active cardiopulmonary disease. Electronically Signed   By: Rolm Baptise M.D.   On: 04/30/2015 11:41    PFT 05/02/15 >> FEV1 3.59 (96%), FEV1% 83, TLC 7.35 (99%), DLCO 102%, no BD    Will have my nurse inform pt that CXR and PFT were normal.

## 2015-05-13 ENCOUNTER — Ambulatory Visit: Payer: BLUE CROSS/BLUE SHIELD | Admitting: Endocrinology

## 2015-05-14 ENCOUNTER — Ambulatory Visit: Payer: BLUE CROSS/BLUE SHIELD | Admitting: Adult Health

## 2015-05-14 NOTE — Telephone Encounter (Signed)
Spoke with pt and notified of results per Dr. Sood. Pt verbalized understanding and denied any questions.  

## 2015-05-16 NOTE — Telephone Encounter (Signed)
Error, will close.

## 2015-05-19 ENCOUNTER — Ambulatory Visit (INDEPENDENT_AMBULATORY_CARE_PROVIDER_SITE_OTHER): Payer: BLUE CROSS/BLUE SHIELD | Admitting: Endocrinology

## 2015-05-19 ENCOUNTER — Encounter: Payer: Self-pay | Admitting: Endocrinology

## 2015-05-19 ENCOUNTER — Other Ambulatory Visit: Payer: Self-pay | Admitting: *Deleted

## 2015-05-19 VITALS — BP 136/72 | HR 80 | Temp 98.3°F | Resp 14 | Ht 72.0 in | Wt 225.6 lb

## 2015-05-19 DIAGNOSIS — E291 Testicular hypofunction: Secondary | ICD-10-CM | POA: Diagnosis not present

## 2015-05-19 DIAGNOSIS — Z23 Encounter for immunization: Secondary | ICD-10-CM | POA: Diagnosis not present

## 2015-05-19 DIAGNOSIS — E1165 Type 2 diabetes mellitus with hyperglycemia: Secondary | ICD-10-CM

## 2015-05-19 DIAGNOSIS — E782 Mixed hyperlipidemia: Secondary | ICD-10-CM

## 2015-05-19 MED ORDER — ONETOUCH ULTRASOFT LANCETS MISC: Status: DC

## 2015-05-19 MED ORDER — LIRAGLUTIDE 18 MG/3ML ~~LOC~~ SOPN: PEN_INJECTOR | SUBCUTANEOUS | Status: DC

## 2015-05-19 MED ORDER — EMPAGLIFLOZIN 10 MG PO TABS: 10.0000 mg | ORAL_TABLET | Freq: Every day | ORAL | Status: DC

## 2015-05-19 MED ORDER — GLIPIZIDE ER 5 MG PO TB24: 5.0000 mg | ORAL_TABLET | Freq: Every day | ORAL | Status: DC

## 2015-05-19 MED ORDER — INSULIN PEN NEEDLE 31G X 5 MM MISC: Status: DC

## 2015-05-19 NOTE — Patient Instructions (Addendum)
May take 2 niacin twice daily  Check blood sugars on waking up   times a week Also check blood sugars about 2 hours after a meal and do this after different meals by rotation  Recommended blood sugar levels on waking up is 90-130 and about 2 hours after meal is 130-160  Please bring your blood sugar monitor to each visit, thank you  Keep exercising

## 2015-05-19 NOTE — Progress Notes (Signed)
Patient ID: [name] [name], male   DOB: [birth year], 64 y.o.   MRN: [id number]   Reason for Appointment: Diabetes follow-up   History of Present Illness   DIAGNOSIS: Made in 2010, Type 2 diabetes mellitus.    He previously was on a regimen of glipizide 5 mg , Januvia and  Invokana.  He had been restarted on Victoza in  4/15 when he was having a tendency to weight gain and not controlling his portions. With gradually increasing the dose he was tolerating 1.8 mg without difficulty  Recent history:   He has had fairly good control of his diabetes overall This is with a multidrug regimen including Victoza, Jardiance 10 mg, glipizide ER Because of complaints of frequent urination or nocturia he is on a trial of Hydrographic surveyor of Invokana now  Current blood sugar patterns, management and problems identified:  His A1c is slightly higher at 7.1  He is not able to lose weight partly because of inconsistent diet and also not exercising enough  He says he is tolerating his Jardiance better than Invokana with nocturia only once.  He is not checking blood sugars much after evening meal which is his main meal  Blood sugars are variably high after lunch, occasionally morbid increase carbohydrate intake  Also trying to be fairly consistent with exercise using the exercise bike  Side effects from medications: None.   Glucose monitoring: Every other day or so  Glucometer:    One Touch          Blood Glucose readings:   Mean values apply above for all meters except median for One Touch  PRE-MEAL Fasting Lunch  3 PM  Bedtime Overall  Glucose range:  89-138   126-184   126-180   161    Mean/median:  124    146    135     Hypoglycemia: None but he will get a headache when the blood sugars 80-90  Meals: content is variable, larger dinner; has been better with choices.  Occasionally he has high fat meals  He knows that high carbohydrate foods will raise his blood sugar.    Physical activity: exercise: Exercise bike or biking outside 3-4/7 Diabetes Educator visit: Most recent: 3/13 .  Dietician visit: Most recent: 09/2011.   Wt Readings from Last 3 Encounters:  05/19/15 225 lb 9.6 oz (102.331 kg)  04/30/15 226 lb 12.8 oz (102.876 kg)  03/20/15 225 lb 6.4 oz (102.241 kg)   Lab Results  Component Value Date   HGBA1C 7.1* 05/07/2015   HGBA1C 6.9* 10/07/2014   HGBA1C 7.6* 07/25/2014   Lab Results  Component Value Date   MICROALBUR <0.7 01/06/2015   LDLCALC 71 01/06/2015   CREATININE 0.86 05/07/2015    Hyperlipidemia:   Patient is on low-dose Crestor 10 mg which he now takes 1 alternating  with a half with no further side effects He now thinks that he is taking 1500 mg of Slo-Niacin a day No history of CAD.   LDL particle number initially was 1632 . LDL particle number = 1095 in 12/14 at the lipid clinic although particle size still relatively low at 20.3 Most recent LDL particle number was 1125, previously 1359 when his niacin was increased   Recently lab results:  Lab Results  Component Value Date   CHOL 131 01/06/2015   HDL 44.60 01/06/2015   LDLCALC 71 01/06/2015   LDLDIRECT 65.8 03/28/2014   TRIG 77.0 01/06/2015   CHOLHDL 3 01/06/2015  Medication List       This list is accurate as of: 05/19/15 12:56 PM.  Always use your most recent med list.               Albuterol Sulfate 108 (90 BASE) MCG/ACT Aepb  Commonly known as:  PROAIR RESPICLICK  Inhale 2 puffs into the lungs every 6 (six) hours as needed.     amLODipine-benazepril 10-20 MG capsule  Commonly known as:  LOTREL  Take 1 capsule by mouth daily.     aspirin 81 MG tablet  Take 81 mg by mouth daily.     ASTEPRO 0.15 % Soln  Generic drug:  Azelastine HCl  Place into the nose as needed.     fexofenadine 30 MG tablet  Commonly known as:  ALLEGRA  Take 180 mg by mouth 2 (two) times daily.     fluticasone 50  MCG/ACT nasal spray  Commonly known as:  FLONASE     glipiZIDE 5 MG 24 hr tablet  Commonly known as:  GLUCOTROL XL  Take 1 tablet (5 mg total) by mouth daily with breakfast.     Insulin Pen Needle 31G X 5 MM Misc  Use to inject victoza     JARDIANCE 10 MG Tabs tablet  Generic drug:  empagliflozin  TAKE 1 TABLET BY MOUTH DAILY     Liraglutide 18 MG/3ML Sopn  Commonly known as:  VICTOZA  INJECT 1.8 MG UNDER THE SKIN DAILY     VICTOZA 18 MG/3ML Sopn  Generic drug:  Liraglutide  INJECT 1.8 MLS UNDER THE SKIN DAILY     Melatonin 5 MG Tabs  Take 1 tablet by mouth at bedtime as needed and may repeat dose one time if needed.     multivitamin tablet  Take 1 tablet by mouth 2 (two) times daily.     niacin 500 MG tablet  Take 1,000 mg by mouth daily with breakfast.     ONE TOUCH ULTRA TEST test strip  Generic drug:  glucose blood     onetouch ultrasoft lancets  Use as instructed     OSTEO BI-FLEX ADV DOUBLE ST PO  Take by mouth 2 (two) times daily.     PROBIOTIC PO  Take by mouth daily.     rosuvastatin 10 MG tablet  Commonly known as:  CRESTOR  Take 1 tablet (10 mg total) by mouth daily.     Vitamin D3 3000 UNITS Tabs  Take 1 tablet by mouth daily.        Allergies:  Allergies  Allergen Reactions  . Metformin Diarrhea    Severe diarrhea  . Sulfonamide Derivatives     REACTION: Hives  . Sulfa Antibiotics Rash    Past Medical History  Diagnosis Date  . Depression   . Mild obesity   . Chronic gouty arthritis   . Hypertension   . Dysphagia, unspecified(787.20)   . Esophageal stricture   . Hiatal hernia with gastroesophageal reflux   . Diverticulosis of colon   . Family history of colon cancer   . Allergy     seasonal  . Asthma   . Diabetes mellitus without complication ([place])   . Hyperlipidemia   . Sleep apnea     cpap    Past Surgical History  Procedure Laterality Date  . Ankle surgery    . Colonoscopy    . Nasal septum surgery    . Umbilical  hernia repair    . Esophagogastroduodenoscopy  Family History  Problem Relation Age of Onset  . Colon cancer Father     onset at age 79  . Heart disease Father   . Diabetes Mother   . Heart disease Mother     Social History:  reports that he quit smoking about 36 years ago. His smoking use included Cigarettes. He has a 3 pack-year smoking history. He has never used smokeless tobacco. He reports that he drinks alcohol. He reports that he does not use illicit drugs.  Review of Systems -  HYPOGONADISM: He thinks this was diagnosed about 3 or so years ago by his PCP but no details are available He says he was having symptoms of fatigue and depression at the onset and this had not improved with taking testosterone gel He had been on and off testosterone supplements since then from his urologist Diagnosis of hypogonadism made by urologist T=212 Subsequent testosterone levels were still in the 200+ range   Since he has been off the testosterone supplements he has not been feeling any more fatigued Also his PSA from the primary care physician in 12/15 was 4.4, unchanged from 2014 Recent free testosterone level is low    He is on CPAP for sleep apnea  Foot exam was done in 12/15 by PCP Eye exam 9/16  HYPERTENSION: BP checked periodically at home, he is on Lotrel from his PCP.  Recent readings about the same as in the office today   Examination:   BP 136/72 mmHg  Pulse 80  Temp(Src) 98.3 F (36.8 C)  Resp 14  Ht 6' (1.829 m)  Wt 225 lb 9.6 oz (102.331 kg)  BMI 30.59 kg/m2  SpO2 97%  Body mass index is 30.59 kg/(m^2).   No ankle edema  Assesment:   Diabetes type 2:   The patient's diabetes control appears to be fair with some variability based on his compliance with diet and exercise Also may be slightly higher with switching from Invokana to [place] which is causing less nocturia  He does need to do more readings after his evening meal and be consistent diet He  believes he can do better with exercise regimen also  HYPOGONADISM: He has mild hypogonadotropic hypogonadism His free testosterone is relatively low but he is asymptomatic currently We'll continue to monitor periodically Needs to continue efforts to lose weight  LIPIDS: He still has relatively higher LDL particle number but improved from baseline of about 1600 He is willing to try 2000 mg of niacin, discussed that it may possibly races sugar Continue Crestor as before  Discussed Prevnar, has not had this before  Continue annual eye exams, to have his complete physical with PCP next month   Counseling time on subjects discussed above is over 50% of today's 25 minute visit   Petra Dumler 05/19/2015, 12:56 PM  .

## 2015-06-02 ENCOUNTER — Other Ambulatory Visit: Payer: Self-pay | Admitting: Endocrinology

## 2015-07-07 ENCOUNTER — Ambulatory Visit: Payer: BLUE CROSS/BLUE SHIELD | Admitting: Adult Health

## 2015-07-18 ENCOUNTER — Ambulatory Visit (INDEPENDENT_AMBULATORY_CARE_PROVIDER_SITE_OTHER): Payer: BLUE CROSS/BLUE SHIELD | Admitting: Adult Health

## 2015-07-18 ENCOUNTER — Encounter: Payer: Self-pay | Admitting: Adult Health

## 2015-07-18 VITALS — BP 130/74 | HR 82 | Temp 98.6°F | Ht 72.0 in | Wt 223.0 lb

## 2015-07-18 DIAGNOSIS — G4733 Obstructive sleep apnea (adult) (pediatric): Secondary | ICD-10-CM | POA: Diagnosis not present

## 2015-07-18 DIAGNOSIS — R05 Cough: Secondary | ICD-10-CM

## 2015-07-18 DIAGNOSIS — R059 Cough, unspecified: Secondary | ICD-10-CM | POA: Insufficient documentation

## 2015-07-18 NOTE — Assessment & Plan Note (Addendum)
Cough -resolved Workup - PFT and CXR nml

## 2015-07-18 NOTE — Progress Notes (Signed)
Subjective:    Patient ID: [name] [name], male    DOB: 1950-07-13, 65 y.o.   MRN: [id number]  HPI 65 yo male former smoker with OSA   TEST  PSG 07/03/01 >> AHI 49, SpO2 low 81% Auto CPAP 02/12/15 to 03/13/15 >> used on 30 of 30 nights with average 6 hrs and 59 min. Average AHI is 0.4 with median CPAP 7 cm H2O and 95 th percentile CPAP 11 cm H20 CXR 04/2015 nml  PFT 05/02/15 >> FEV1 3.59 (96%), FEV1% 83, TLC 7.35 (99%), DLCO 102%, no BD    07/18/2015 Follow up : OSA and Cough  Patient returns for a one-month follow-up Patient was seen last visit for a persistent cough. He was set up for a chest x-ray that was normal, and pulmonary function test that showed no airflow obstruction with an FEV1 at 96%. Ratio 83. Cough is improved since last visit and is almost totally resolved. He does have some lingering nasal congestion on and off. Patient was also given pro air to use as needed. Patient says he's had rare use of this.  He remains on C Pap at bedtime. Denies any issues and feels rested.     Past Medical History  Diagnosis Date  . Depression   . Mild obesity   . Chronic gouty arthritis   . Hypertension   . Dysphagia, unspecified(787.20)   . Esophageal stricture   . Hiatal hernia with gastroesophageal reflux   . Diverticulosis of colon   . Family history of colon cancer   . Allergy     seasonal  . Asthma   . Diabetes mellitus without complication ([place])   . Hyperlipidemia   . Sleep apnea     cpap   Current Outpatient Prescriptions on File Prior to Visit  Medication Sig Dispense Refill  . Albuterol Sulfate (PROAIR RESPICLICK) 123XX123 (90 BASE) MCG/ACT AEPB Inhale 2 puffs into the lungs every 6 (six) hours as needed. 1 each 5  . amLODipine-benazepril (LOTREL) 10-20 MG per capsule Take 1 capsule by mouth daily.     Marland Kitchen aspirin 81 MG tablet Take 81 mg by mouth daily.    . Azelastine HCl (ASTEPRO) 0.15 % SOLN Place into the nose as needed.    . Cholecalciferol (VITAMIN D3) 3000  UNITS TABS Take 1 tablet by mouth daily.    . empagliflozin (JARDIANCE) 10 MG TABS tablet Take 10 mg by mouth daily. 30 tablet 3  . fexofenadine (ALLEGRA) 30 MG tablet Take 180 mg by mouth 2 (two) times daily.    . fluticasone (FLONASE) 50 MCG/ACT nasal spray Place 2 sprays into both nostrils daily.     Marland Kitchen glipiZIDE (GLUCOTROL XL) 5 MG 24 hr tablet Take 1 tablet (5 mg total) by mouth daily with breakfast. 90 tablet 1  . Insulin Pen Needle 31G X 5 MM MISC Use to inject victoza 90 each 1  . Lancets (ONETOUCH ULTRASOFT) lancets Use as instructed 100 each 1  . Liraglutide (VICTOZA) 18 MG/3ML SOPN INJECT 1.8 MG UNDER THE SKIN DAILY 9 pen 1  . Melatonin 5 MG TABS Take 1 tablet by mouth at bedtime as needed and may repeat dose one time if needed.    . Misc Natural Products (OSTEO BI-FLEX ADV DOUBLE ST PO) Take by mouth 2 (two) times daily.    . Multiple Vitamin (MULTIVITAMIN) tablet Take 1 tablet by mouth 2 (two) times daily.    . niacin 500 MG tablet Take 1,000 mg by mouth  daily with breakfast.     . ONE TOUCH ULTRA TEST test strip     . Probiotic Product (PROBIOTIC PO) Take by mouth daily.    . rosuvastatin (CRESTOR) 10 MG tablet Take 1 tablet (10 mg total) by mouth daily. 90 tablet 1   No current facility-administered medications on file prior to visit.     Review of Systems Constitutional:   No  weight loss, night sweats,  Fevers, chills, fatigue, or  lassitude.  HEENT:   No headaches,  Difficulty swallowing,  Tooth/dental problems, or  Sore throat,                No sneezing, itching, ear ache,  +nasal congestion  CV:  No chest pain,  Orthopnea, PND, swelling in lower extremities, anasarca, dizziness, palpitations, syncope.   GI  No heartburn, indigestion, abdominal pain, nausea, vomiting, diarrhea, change in bowel habits, loss of appetite, bloody stools.   Resp: No shortness of breath with exertion or at rest.  No excess mucus, no productive cough,  No non-productive cough,  No coughing  up of blood.  No change in color of mucus.  No wheezing.  No chest wall deformity  Skin: no rash or lesions.  GU: no dysuria, change in color of urine, no urgency or frequency.  No flank pain, no hematuria   MS:  No joint pain or swelling.  No decreased range of motion.  No back pain.  Psych:  No change in mood or affect. No depression or anxiety.  No memory loss.         Objective:   Physical Exam  Filed Vitals:   07/18/15 0944  BP: 130/74  Pulse: 82  Temp: 98.6 F (37 C)  TempSrc: Oral  Height: 6' (1.829 m)  Weight: 223 lb (101.152 kg)  SpO2: 96%   GEN: A/Ox3; pleasant   HEENT:  [place]/AT,  EACs-clear, TMs-wnl, NOSE-clear, THROAT-clear, no lesions, no postnasal drip or exudate noted.   NECK:  Supple w/ fair ROM; no JVD; normal carotid impulses w/o bruits; no thyromegaly or nodules palpated; no lymphadenopathy.  RESP  Clear  P & A; w/o, wheezes/ rales/ or rhonchi.no accessory muscle use, no dullness to percussion  CARD:  RRR, no m/r/g  , no peripheral edema, pulses intact, no cyanosis or clubbing.  GI:   Soft & nt; nml bowel sounds; no organomegaly or masses detected.  Musco: Warm bil, no deformities or joint swelling noted.   Neuro: alert, no focal deficits noted.    Skin: Warm, no lesions or rashes        Assessment & Plan:

## 2015-07-18 NOTE — Patient Instructions (Signed)
Follow up in 1 year with Dr. Halford Chessman  And As needed

## 2015-07-18 NOTE — Progress Notes (Signed)
Reviewed and agree with assessment/plan. 

## 2015-07-18 NOTE — Assessment & Plan Note (Signed)
Cont w. CPAP At bedtime

## 2015-09-01 ENCOUNTER — Other Ambulatory Visit: Payer: Self-pay | Admitting: *Deleted

## 2015-09-01 MED ORDER — EMPAGLIFLOZIN 10 MG PO TABS: 10.0000 mg | ORAL_TABLET | Freq: Every day | ORAL | Status: DC

## 2015-09-26 ENCOUNTER — Other Ambulatory Visit (INDEPENDENT_AMBULATORY_CARE_PROVIDER_SITE_OTHER): Payer: BLUE CROSS/BLUE SHIELD

## 2015-09-26 DIAGNOSIS — E1165 Type 2 diabetes mellitus with hyperglycemia: Secondary | ICD-10-CM

## 2015-09-26 DIAGNOSIS — E782 Mixed hyperlipidemia: Secondary | ICD-10-CM | POA: Diagnosis not present

## 2015-09-26 DIAGNOSIS — E291 Testicular hypofunction: Secondary | ICD-10-CM

## 2015-09-26 LAB — LIPID PANEL
CHOLESTEROL: 124 mg/dL (ref 0–200)
HDL: 53.6 mg/dL (ref >39.00)
LDL CALC: 50 mg/dL (ref 0–99)
NonHDL: 70.23
Total CHOL/HDL Ratio: 2
Triglycerides: 102 mg/dL (ref 0.0–149.0)
VLDL: 20.4 mg/dL (ref 0.0–40.0)

## 2015-09-26 LAB — COMPREHENSIVE METABOLIC PANEL
ALBUMIN: 4.1 g/dL (ref 3.5–5.2)
ALT: 98 U/L — ABNORMAL HIGH (ref 0–53)
AST: 54 U/L — ABNORMAL HIGH (ref 0–37)
Alkaline Phosphatase: 159 U/L — ABNORMAL HIGH (ref 39–117)
BUN: 15 mg/dL (ref 6–23)
CHLORIDE: 100 meq/L (ref 96–112)
CO2: 28 mEq/L (ref 19–32)
Calcium: 9 mg/dL (ref 8.4–10.5)
Creatinine, Ser: 0.76 mg/dL (ref 0.40–1.50)
GFR: 109.43 mL/min (ref >60.00)
GLUCOSE: 133 mg/dL — AB (ref 70–99)
POTASSIUM: 4 meq/L (ref 3.5–5.1)
SODIUM: 137 meq/L (ref 135–145)
Total Bilirubin: 1 mg/dL (ref 0.2–1.2)
Total Protein: 6.6 g/dL (ref 6.0–8.3)

## 2015-09-26 LAB — TESTOSTERONE: TESTOSTERONE: 237.6 ng/dL — AB (ref 300.00–890.00)

## 2015-09-26 LAB — HEMOGLOBIN A1C: HEMOGLOBIN A1C: 7.7 % — AB (ref 4.6–6.5)

## 2015-09-30 ENCOUNTER — Ambulatory Visit (INDEPENDENT_AMBULATORY_CARE_PROVIDER_SITE_OTHER): Payer: Medicare Other | Admitting: Endocrinology

## 2015-09-30 ENCOUNTER — Encounter: Payer: Self-pay | Admitting: Endocrinology

## 2015-09-30 ENCOUNTER — Telehealth: Payer: Self-pay | Admitting: *Deleted

## 2015-09-30 VITALS — BP 136/72 | HR 82 | Temp 98.7°F | Resp 14 | Wt 212.0 lb

## 2015-09-30 DIAGNOSIS — E782 Mixed hyperlipidemia: Secondary | ICD-10-CM | POA: Diagnosis not present

## 2015-09-30 DIAGNOSIS — E1165 Type 2 diabetes mellitus with hyperglycemia: Secondary | ICD-10-CM | POA: Diagnosis not present

## 2015-09-30 NOTE — Progress Notes (Signed)
Patient ID: [name] [name], male   DOB: 12-01-1950, 65 y.o.   MRN: [id number]   Reason for Appointment: Diabetes follow-up   History of Present Illness   DIAGNOSIS: Made in 2010, Type 2 diabetes mellitus.    He previously was on a regimen of glipizide 5 mg , Januvia and  Invokana.  He had been restarted on Victoza in  4/15 when he was having a tendency to weight gain and not controlling his portions. With gradually increasing the dose he was tolerating 1.8 mg without difficulty  Recent history:   He has had fairly good control of his diabetes overall but his A1c is relatively higher at 7.7 now This is with continuing a multidrug regimen including Victoza, Jardiance 10 mg, glipizide ER   Also he has lost weight since his last visit which previously had not been improving  Current blood sugar patterns, management and problems identified:  His  Blood sugars are being checked very sporadically at home and only has 6 readings in the last month    has only 2 readings after meals on 3/23 which were minimally high at 152 and 164   Fasting readings range from 96-101  ; was 133 in the lab   he says he is making concerted efforts to cut back on portions at meals even when going out to eat and this has helped him lose a significant amount of weight since his last visit   he is still trying to exercise as much as possible   He says he is tolerating his Jardiance better than Invokana with nocturia only once.    Also trying to be fairly consistent with exercise using the exercise bike  Side effects from medications: None.   Glucose monitoring: Every other day or so  Glucometer:    One Touch          Blood Glucose readings:   Mean values apply above for all meters except median for One Touch  PRE-MEAL Fasting Lunch Dinner Bedtime Overall  Glucose range:       Mean/median:        POST-MEAL PC Breakfast PC Lunch PC Dinner  Glucose range:     Mean/median:       Mean  values apply above for all meters except median for One Touch  PRE-MEAL Fasting Lunch  3 PM  Bedtime Overall  Glucose range:  89-138   126-184   126-180   161    Mean/median:  124    146    135     Hypoglycemia: None but he will get a headache when the blood sugars 80-90  Meals: content is variable, has been better with choices.  Occasionally he has high fat meals  He knows that high carbohydrate foods will raise his blood sugar.   Physical activity: exercise: Exercise bike or biking outside 3-4/7 Diabetes Educator visit: Most recent: 3/13 .  Dietician visit: Most recent: 09/2011.   Wt Readings from Last 3 Encounters:  09/30/15 212 lb (96.163 kg)  07/18/15 223 lb (101.152 kg)  05/19/15 225 lb 9.6 oz (102.331 kg)   Lab Results  Component Value Date   HGBA1C 7.7* 09/26/2015   HGBA1C 7.1* 05/07/2015   HGBA1C 6.9* 10/07/2014   Lab Results  Component Value Date   MICROALBUR <0.7 01/06/2015   LDLCALC 50 09/26/2015   CREATININE 0.76 09/26/2015    Hyperlipidemia:   Patient is on low-dose Crestor 10 mg which he takes 1 alternating  with a half with no further side effects He now thinks that he is taking 2000 mg of Slo-Niacin a day , his dose was increased in 11/16 No history of CAD.   LDL particle number initially was 1632 . LDL particle number = 1095 in 12/14 at the lipid clinic although particle size still relatively low at 20.3 Most recent LDL particle number was 1125, previously 1359    Recent lab results:  Lab Results  Component Value Date   CHOL 124 09/26/2015   CHOL 131 01/06/2015   CHOL 154 07/25/2014   Lab Results  Component Value Date   HDL 53.60 09/26/2015   HDL 44.60 01/06/2015   HDL 43.40 07/25/2014   Lab Results  Component Value Date   LDLCALC 50 09/26/2015   LDLCALC 71 01/06/2015   LDLCALC 75 07/25/2014   Lab Results  Component Value Date   TRIG 102.0 09/26/2015   TRIG 77.0 01/06/2015   TRIG 178.0* 07/25/2014   Lab Results  Component  Value Date   CHOLHDL 2 09/26/2015   CHOLHDL 3 01/06/2015   CHOLHDL 4 07/25/2014   Lab Results  Component Value Date   LDLDIRECT 65.8 03/28/2014    Lab Results  Component Value Date   CHOL 124 09/26/2015   HDL 53.60 09/26/2015   LDLCALC 50 09/26/2015   LDLDIRECT 65.8 03/28/2014   TRIG 102.0 09/26/2015   CHOLHDL 2 09/26/2015                                                 Medication List       This list is accurate as of: 09/30/15  5:01 PM.  Always use your most recent med list.               Albuterol Sulfate 108 (90 Base) MCG/ACT Aepb  Commonly known as:  PROAIR RESPICLICK  Inhale 2 puffs into the lungs every 6 (six) hours as needed.     amLODipine-benazepril 10-20 MG capsule  Commonly known as:  LOTREL  Take 1 capsule by mouth daily.     aspirin 81 MG tablet  Take 81 mg by mouth daily.     ASTEPRO 0.15 % Soln  Generic drug:  Azelastine HCl  Place into the nose as needed.     empagliflozin 10 MG Tabs tablet  Commonly known as:  JARDIANCE  Take 10 mg by mouth daily.     fexofenadine 30 MG tablet  Commonly known as:  ALLEGRA  Take 180 mg by mouth 2 (two) times daily.     fluticasone 50 MCG/ACT nasal spray  Commonly known as:  FLONASE  Place 2 sprays into both nostrils daily.     glipiZIDE 5 MG 24 hr tablet  Commonly known as:  GLUCOTROL XL  Take 1 tablet (5 mg total) by mouth daily with breakfast.     Insulin Pen Needle 31G X 5 MM Misc  Use to inject victoza     Liraglutide 18 MG/3ML Sopn  Commonly known as:  VICTOZA  INJECT 1.8 MG UNDER THE SKIN DAILY     Melatonin 5 MG Tabs  Take 1 tablet by mouth at bedtime as needed and may repeat dose one time if needed.     multivitamin tablet  Take 1 tablet by mouth 2 (two) times daily.     niacin 500 MG  tablet  Take 1,000 mg by mouth daily with breakfast.     ONE TOUCH ULTRA TEST test strip  Generic drug:  glucose blood     onetouch ultrasoft lancets  Use as instructed     OSTEO BI-FLEX ADV  DOUBLE ST PO  Take by mouth 2 (two) times daily.     PROBIOTIC PO  Take by mouth daily.     rosuvastatin 10 MG tablet  Commonly known as:  CRESTOR  Take 1 tablet (10 mg total) by mouth daily.     Vitamin D3 3000 units Tabs  Take 1 tablet by mouth daily.        Allergies:  Allergies  Allergen Reactions  . Metformin Diarrhea    Severe diarrhea  . Sulfonamide Derivatives     REACTION: Hives  . Sulfa Antibiotics Rash    Past Medical History  Diagnosis Date  . Depression   . Mild obesity   . Chronic gouty arthritis   . Hypertension   . Dysphagia, unspecified(787.20)   . Esophageal stricture   . Hiatal hernia with gastroesophageal reflux   . Diverticulosis of colon   . Family history of colon cancer   . Allergy     seasonal  . Asthma   . Diabetes mellitus without complication ([place])   . Hyperlipidemia   . Sleep apnea     cpap    Past Surgical History  Procedure Laterality Date  . Ankle surgery    . Colonoscopy    . Nasal septum surgery    . Umbilical hernia repair    . Esophagogastroduodenoscopy      Family History  Problem Relation Age of Onset  . Colon cancer Father     onset at age 68  . Heart disease Father   . Diabetes Mother   . Heart disease Mother     Social History:  reports that he quit smoking about 37 years ago. His smoking use included Cigarettes. He has a 3 pack-year smoking history. He has never used smokeless tobacco. He reports that he drinks alcohol. He reports that he does not use illicit drugs.  Review of Systems -  HYPOGONADISM: He thinks this was diagnosed about  The year 2013 by his PCP but no details are available He says he was having symptoms of fatigue and depression at the onset and this had not improved with taking testosterone gel He had been on and off testosterone supplements since then from his urologist Diagnosis of hypogonadism made by urologist T=212 Subsequent testosterone levels were still in the 200+ range    Since he has been off the testosterone supplements he has not been feeling any more fatigued Also his PSA from the primary care physician in 12/15 was 4.4, unchanged from 2014  His testosterone level stays consistently about the same including a low free testosterone  Foot exam was done in 12/16 by PCP   He is asking about mild numbness in his left fifth toe at times but he thinks it may be better with changing issue  Eye exam 9/16  HYPERTENSION: BP checked periodically at home, he is on Lotrel from his PCP.     Examination:   BP 136/72 mmHg  Pulse 82  Temp(Src) 98.7 F (37.1 C) (Oral)  Resp 14  Wt 212 lb (96.163 kg)  SpO2 97%  Body mass index is 28.75 kg/(m^2).   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 He has not had any new medications and only a 500 mg increase in his niacin dose He does take  Aleve only once a day but only one tablet  LIPIDS: He has excellent lipid levels and improved LDL and HDL possibly from weight loss, improved diet and also increased niacin   Continue Crestor as before   He will stop niacin and check liver functions in 3 weeks   Numbness left first toe : Not clear if this is a pressure effect or mild neuropathy symptoms are mild and improving   Counseling time on subjects discussed above is over 50% of today's 25 minute visit   Alsie Younes 09/30/2015, 5:01 PM  .  Note: This office note was  prepared with Estate agent. Any transcriptional errors that result from this process are unintentional.

## 2015-09-30 NOTE — Telephone Encounter (Signed)
Noted, he said he's finished taking it now.

## 2015-09-30 NOTE — Telephone Encounter (Signed)
Patient called today.  He said you told him at his visit that his liver levels were high and asked if he had been taking anything other than the 2000 mg of Niacin and he said no, but his wife reminded him that for about 2 months he's been taking 1 Aleve a day and wonders if that could have caused his levels to increase.

## 2015-09-30 NOTE — Telephone Encounter (Signed)
Unlikely but he can try to reduce it is possible

## 2015-09-30 NOTE — Patient Instructions (Signed)
Stop Niacin  Check blood sugars on waking up 2  times a week Also check blood sugars about 2 hours after a meal and do this after different meals by rotation  Recommended blood sugar levels on waking up is 90-130 and about 2 hours after meal is 130-160  Please bring your blood sugar monitor to each visit, thank you

## 2015-10-24 ENCOUNTER — Other Ambulatory Visit (INDEPENDENT_AMBULATORY_CARE_PROVIDER_SITE_OTHER): Payer: BLUE CROSS/BLUE SHIELD

## 2015-10-24 DIAGNOSIS — E1165 Type 2 diabetes mellitus with hyperglycemia: Secondary | ICD-10-CM | POA: Diagnosis not present

## 2015-10-24 LAB — COMPREHENSIVE METABOLIC PANEL
ALK PHOS: 77 U/L (ref 39–117)
ALT: 25 U/L (ref 0–53)
AST: 18 U/L (ref 0–37)
Albumin: 4.2 g/dL (ref 3.5–5.2)
BUN: 13 mg/dL (ref 6–23)
CALCIUM: 9 mg/dL (ref 8.4–10.5)
CO2: 28 mEq/L (ref 19–32)
Chloride: 103 mEq/L (ref 96–112)
Creatinine, Ser: 0.85 mg/dL (ref 0.40–1.50)
GFR: 96.15 mL/min (ref >60.00)
GLUCOSE: 192 mg/dL — AB (ref 70–99)
POTASSIUM: 4.2 meq/L (ref 3.5–5.1)
Sodium: 138 mEq/L (ref 135–145)
TOTAL PROTEIN: 6.6 g/dL (ref 6.0–8.3)
Total Bilirubin: 0.5 mg/dL (ref 0.2–1.2)

## 2015-10-28 ENCOUNTER — Other Ambulatory Visit: Payer: Self-pay | Admitting: *Deleted

## 2015-10-31 ENCOUNTER — Telehealth: Payer: Self-pay | Admitting: Endocrinology

## 2015-10-31 NOTE — Telephone Encounter (Signed)
PT wife called and said that he was supposed to be hearing back from lab work the other week and have not heard anything. They would like a call back

## 2015-11-04 NOTE — Telephone Encounter (Signed)
Patient's wife called again because the patient needs to know the lab results and also wants to know how to proceed from here.

## 2015-11-04 NOTE — Telephone Encounter (Signed)
Please see below and advise.

## 2015-11-04 NOTE — Telephone Encounter (Signed)
Liver tests are back to normal.  Need to know if he was taking Slo-Niacin or some other brand of niacin.

## 2015-11-04 NOTE — Telephone Encounter (Signed)
Mr. Capo said he had been taking the Niacin as well as aleve, but when he learned that his results were abnormal he stopped taking them.

## 2015-11-04 NOTE — Telephone Encounter (Signed)
Patients wife wants to know if it's okay for him to start taking the Niacin again?  If so she wants to know what mg he should be taking?

## 2015-11-05 NOTE — Telephone Encounter (Signed)
Noted, patients wife is aware, she said he had been taking the Slo-Niacin.

## 2015-11-05 NOTE — Telephone Encounter (Signed)
He can start with 500 mg of Slo-Niacin.  Need to know if he was taking this brand, should not take any other

## 2015-12-10 ENCOUNTER — Other Ambulatory Visit: Payer: Self-pay | Admitting: Endocrinology

## 2015-12-11 ENCOUNTER — Telehealth: Payer: Self-pay | Admitting: Endocrinology

## 2015-12-11 NOTE — Telephone Encounter (Signed)
Patient need a PA for medication Glipizide and Lancets (ONETOUCH ULTRASOFT) lancets for  [facility].   And the one for the ONE TOUCH ULTRA TEST test strip need a PA  Send to [facility]

## 2015-12-12 MED ORDER — ONETOUCH ULTRASOFT LANCETS MISC: Status: AC

## 2015-12-12 MED ORDER — GLUCOSE BLOOD VI STRP: ORAL_STRIP | Status: DC

## 2015-12-12 MED ORDER — GLIPIZIDE ER 5 MG PO TB24: 5.0000 mg | ORAL_TABLET | Freq: Every day | ORAL | Status: DC

## 2015-12-12 NOTE — Telephone Encounter (Signed)
He needs to find from insurance what testing supplies are covered, also why they are not covering glipizide ER

## 2015-12-12 NOTE — Telephone Encounter (Signed)
See below and please advise on how to proceed.

## 2015-12-12 NOTE — Telephone Encounter (Signed)
I contacted the pt's wife. She stated the medicaitons did not need a PA but just a refill at this time. Rx's submitted per pt's request. Requested a call back if the pt had any issues picking the prescriptions up or receiving them through the mail.

## 2015-12-24 ENCOUNTER — Telehealth: Payer: Self-pay | Admitting: Endocrinology

## 2015-12-24 NOTE — Telephone Encounter (Signed)
Patient wife ask if patient could have his lab orders mailed to his home, please advise

## 2015-12-24 NOTE — Telephone Encounter (Signed)
Results mailed to the pt.

## 2015-12-24 NOTE — Telephone Encounter (Signed)
See below

## 2015-12-25 ENCOUNTER — Telehealth: Payer: Self-pay | Admitting: Endocrinology

## 2015-12-31 ENCOUNTER — Other Ambulatory Visit: Payer: Medicare Other

## 2015-12-31 ENCOUNTER — Other Ambulatory Visit: Payer: Self-pay | Admitting: Endocrinology

## 2015-12-31 ENCOUNTER — Other Ambulatory Visit: Payer: Self-pay

## 2015-12-31 DIAGNOSIS — E1165 Type 2 diabetes mellitus with hyperglycemia: Secondary | ICD-10-CM

## 2015-12-31 DIAGNOSIS — E782 Mixed hyperlipidemia: Secondary | ICD-10-CM

## 2016-01-01 LAB — TESTOSTERONE,FREE AND TOTAL
TESTOSTERONE: 245 ng/dL — AB (ref 348–1197)
Testosterone, Free: 5 pg/mL — ABNORMAL LOW (ref 6.6–18.1)

## 2016-01-01 LAB — BASIC METABOLIC PANEL
BUN / CREAT RATIO: 18 (ref 10–24)
BUN: 14 mg/dL (ref 8–27)
CHLORIDE: 98 mmol/L (ref 96–106)
CO2: 20 mmol/L (ref 18–29)
Calcium: 9.1 mg/dL (ref 8.6–10.2)
Creatinine, Ser: 0.77 mg/dL (ref 0.76–1.27)
GFR, EST AFRICAN AMERICAN: 110 mL/min/{1.73_m2} (ref >59)
GFR, EST NON AFRICAN AMERICAN: 95 mL/min/{1.73_m2} (ref >59)
Glucose: 117 mg/dL — ABNORMAL HIGH (ref 65–99)
POTASSIUM: 4.5 mmol/L (ref 3.5–5.2)
Sodium: 139 mmol/L (ref 134–144)

## 2016-01-01 LAB — LIPID PANEL
CHOL/HDL RATIO: 2.4 ratio (ref 0.0–5.0)
Cholesterol, Total: 122 mg/dL (ref 100–199)
HDL: 50 mg/dL (ref >39)
LDL Calculated: 51 mg/dL (ref 0–99)
Triglycerides: 107 mg/dL (ref 0–149)
VLDL CHOLESTEROL CAL: 21 mg/dL (ref 5–40)

## 2016-01-01 LAB — LIPOPROTEIN ANALYSIS BY NMR
HDL PARTICLE NUMBER: 42.7 umol/L (ref >=30.5)
LDL PARTICLE NUMBER: 1053 nmol/L — AB (ref <1000)
LDL SIZE: 20.1 nm (ref >20.5)
LP-IR SCORE: 53 — AB (ref <=45)
Small LDL Particle Number: 667 nmol/L — ABNORMAL HIGH (ref <=527)

## 2016-01-01 LAB — HEMOGLOBIN A1C
ESTIMATED AVERAGE GLUCOSE: 143 mg/dL
HEMOGLOBIN A1C: 6.6 % — AB (ref 4.8–5.6)

## 2016-01-02 ENCOUNTER — Ambulatory Visit: Payer: Medicare Other | Admitting: Endocrinology

## 2016-01-05 NOTE — Telephone Encounter (Signed)
See below. I believe these results are on your desk. Thanks!

## 2016-01-05 NOTE — Telephone Encounter (Signed)
We can discuss at the next visit, since it will take time

## 2016-01-05 NOTE — Telephone Encounter (Signed)
Patient ask if [facility] faxed over patient labs results, if so could you call her today.

## 2016-01-05 NOTE — Telephone Encounter (Signed)
I contacted the pt and pt's wife and left a vm advising of note below. Requested a call back from the pt to schedule if he does not have an appt at this time.

## 2016-01-05 NOTE — Telephone Encounter (Signed)
Have them

## 2016-01-05 NOTE — Telephone Encounter (Signed)
Could you review and please advise, Thanks!

## 2016-01-07 ENCOUNTER — Other Ambulatory Visit: Payer: Self-pay

## 2016-01-07 ENCOUNTER — Encounter: Payer: Self-pay | Admitting: Endocrinology

## 2016-01-07 ENCOUNTER — Ambulatory Visit (INDEPENDENT_AMBULATORY_CARE_PROVIDER_SITE_OTHER): Payer: Medicare Other | Admitting: Endocrinology

## 2016-01-07 VITALS — BP 128/78 | HR 83 | Ht 72.0 in | Wt 216.0 lb

## 2016-01-07 DIAGNOSIS — E119 Type 2 diabetes mellitus without complications: Secondary | ICD-10-CM

## 2016-01-07 DIAGNOSIS — E782 Mixed hyperlipidemia: Secondary | ICD-10-CM | POA: Diagnosis not present

## 2016-01-07 MED ORDER — EMPAGLIFLOZIN 10 MG PO TABS: 10.0000 mg | ORAL_TABLET | Freq: Every day | ORAL | Status: DC

## 2016-01-07 NOTE — Progress Notes (Signed)
Patient ID: [name] [name], male   DOB: [birth year], 65 y.o.   MRN: [id number]   Reason for Appointment: Diabetes follow-up   History of Present Illness   DIAGNOSIS: Made in 2010, Type 2 diabetes mellitus.    He previously was on a regimen of glipizide 5 mg , Januvia and  Invokana.  He had been restarted on Victoza in  4/15 when he was having a tendency to weight gain and not controlling his portions. With gradually increasing the dose he was tolerating 1.8 mg without difficulty  Recent history:   He has had fairly good control of his diabetes overall  A1c is down to 6.6 He thinks previously when it was 7.7 he was not watching his diet including eating more carbohydrates in foods This is with continuing a multidrug regimen including Victoza, Jardiance 10 mg, glipizide ER  Current blood sugar patterns, management and problems identified:  His glucose readings at home are excellent with only sporadic higher reading  He thinks his blood sugars are generally higher with eating more carbohydrates which she does at times.  Also thinks glucose is higher with fruits  Although he has regained some of the lost weight his glucose control is significantly better than last time  [place] and he thinks he'll be able to ride his  bike more often   Blood sugars are being checked mostly in the morning with occasional readings at night   Side effects from medications: None.   Glucose monitoring: Every other day or so  Glucometer:    One Touch          Blood Glucose readings:   Mean values apply above for all meters except median for One Touch  PRE-MEAL Fasting Lunch Dinner Bedtime Overall  Glucose range: 92-128    112-174    Mean/median: 118    133  124    Hypoglycemia: None but he will get a headache when the blood sugars 80-90  Meals: content is variable, has been better with choices.  Occasionally he has high fat meals  He knows that high carbohydrate  foods or fruits will raise his blood sugar.   Physical activity: exercise: Exercise bike or biking outside 3/7 Diabetes Educator visit: Most recent: 3/13 .  Dietician visit: Most recent: 09/2011.   Wt Readings from Last 3 Encounters:  01/07/16 216 lb (97.977 kg)  09/30/15 212 lb (96.163 kg)  07/18/15 223 lb (101.152 kg)   Lab Results  Component Value Date   HGBA1C 6.6* 12/31/2015   HGBA1C 7.7* 09/26/2015   HGBA1C 7.1* 05/07/2015   Lab Results  Component Value Date   MICROALBUR <0.7 01/06/2015   Parc 51 12/31/2015   CREATININE 0.77 12/31/2015    Hyperlipidemia:   Patient is on low-dose Crestor 10 mg which he takes 1 alternating  with a half with no further side effects He now is taking 500 mg of Slo-Niacin a day , his dose was Decreased on his last visit since it was unclear whether this was causing liver function abnormality No history of CAD.   LDL particle number initially was 1632 . LDL particle number = 1095 in 12/14 at the lipid clinic although particle size still relatively low at 20.3 Most recent LDL particle number was 1053, previously 1125   Recent lab results:  Lab Results  Component Value Date   CHOL 122 12/31/2015   CHOL 124 09/26/2015   CHOL 131 01/06/2015   Lab  Results  Component Value Date   HDL 50 12/31/2015   HDL 53.60 09/26/2015   HDL 44.60 01/06/2015   Lab Results  Component Value Date   LDLCALC 51 12/31/2015   LDLCALC 50 09/26/2015   LDLCALC 71 01/06/2015   Lab Results  Component Value Date   TRIG 107 12/31/2015   TRIG 102.0 09/26/2015   TRIG 77.0 01/06/2015   Lab Results  Component Value Date   CHOLHDL 2.4 12/31/2015   CHOLHDL 2 09/26/2015   CHOLHDL 3 01/06/2015   Lab Results  Component Value Date   LDLDIRECT 65.8 03/28/2014    Lab Results  Component Value Date   CHOL 122 12/31/2015   HDL 50 12/31/2015   LDLCALC 51 12/31/2015   LDLDIRECT 65.8 03/28/2014   TRIG 107 12/31/2015   CHOLHDL 2.4 12/31/2015                                                  Medication List       This list is accurate as of: 01/07/16 11:59 PM.  Always use your most recent med list.               amLODipine-benazepril 10-20 MG capsule  Commonly known as:  LOTREL  Take 1 capsule by mouth daily.     aspirin 81 MG tablet  Take 81 mg by mouth daily.     CO Q 10 PO  Take 100 mg by mouth.     empagliflozin 10 MG Tabs tablet  Commonly known as:  JARDIANCE  Take 10 mg by mouth daily.     fexofenadine 30 MG tablet  Commonly known as:  ALLEGRA  Take 180 mg by mouth 2 (two) times daily.     fluticasone 50 MCG/ACT nasal spray  Commonly known as:  FLONASE  Place 2 sprays into both nostrils daily.     glipiZIDE 5 MG 24 hr tablet  Commonly known as:  GLIPIZIDE XL  Take 1 tablet (5 mg total) by mouth daily with breakfast.     glucose blood test strip  Commonly known as:  ONE TOUCH ULTRA TEST  Use to check blood sugar 1 time per day.     Insulin Pen Needle 31G X 5 MM Misc  Use to inject victoza     Liraglutide 18 MG/3ML Sopn  Commonly known as:  VICTOZA  INJECT 1.8 MG UNDER THE SKIN DAILY     Melatonin 5 MG Tabs  Take 1 tablet by mouth at bedtime as needed and may repeat dose one time if needed.     METAMUCIL PO  Take by mouth 2 (two) times daily.     multivitamin tablet  Take 1 tablet by mouth 2 (two) times daily.     niacin 500 MG tablet  Take 500 mg by mouth daily with breakfast. Reported on 10/28/2015     onetouch ultrasoft lancets  USE AS DIRECTED     OSTEO BI-FLEX ADV DOUBLE ST PO  Take by mouth 2 (two) times daily.     PROBIOTIC PO  Take by mouth daily.     rosuvastatin 10 MG tablet  Commonly known as:  CRESTOR  Take 1 tablet (10 mg total) by mouth daily.     Turmeric 500 MG Caps  Take 500 mg by mouth 2 (two) times daily.  Vitamin D3 3000 units Tabs  Take 1 tablet by mouth daily.        Allergies:  Allergies  Allergen Reactions  . Metformin Diarrhea    Severe  diarrhea  . Sulfonamide Derivatives     REACTION: Hives  . Sulfa Antibiotics Rash    Past Medical History  Diagnosis Date  . Depression   . Mild obesity   . Chronic gouty arthritis   . Hypertension   . Dysphagia, unspecified(787.20)   . Esophageal stricture   . Hiatal hernia with gastroesophageal reflux   . Diverticulosis of colon   . Family history of colon cancer   . Allergy     seasonal  . Asthma   . Diabetes mellitus without complication ([place])   . Hyperlipidemia   . Sleep apnea     cpap    Past Surgical History  Procedure Laterality Date  . Ankle surgery    . Colonoscopy    . Nasal septum surgery    . Umbilical hernia repair    . Esophagogastroduodenoscopy      Family History  Problem Relation Age of Onset  . Colon cancer Father     onset at age 32  . Heart disease Father   . Diabetes Mother   . Heart disease Mother     Social History:  reports that he quit smoking about 37 years ago. His smoking use included Cigarettes. He has a 3 pack-year smoking history. He has never used smokeless tobacco. He reports that he drinks alcohol. He reports that he does not use illicit drugs.  Review of Systems -  HYPOGONADISM: He thinks this was diagnosed about  The year 2013 by his PCP but no details are available He says he was having symptoms of fatigue and depression at the onset and this had not improved with taking testosterone gel He had been on and off testosterone supplements since then from his urologist Diagnosis of hypogonadism made by urologist T=212 Subsequent testosterone levels were still in the 200+ range   Since he has been off the testosterone supplements he has not been feeling any more fatigued Also his PSA from the primary care physician in 12/15 was 4.4, unchanged from 2014  His testosterone level stays consistently about the same including a low free testosterone  Foot exam was done in 12/16 by PCP   He is asking about mild numbness in his left  fifth toe at times but he thinks it may be better with changing issue  Eye exam 9/16  HYPERTENSION: Controlled, he is on Lotrel from his PCP.     Examination:   BP 128/78 mmHg  Pulse 83  Ht 6' (1.829 m)  Wt 216 lb (97.977 kg)  BMI 29.29 kg/m2  SpO2 93%  Body mass index is 29.29 kg/(m^2).     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  Note: This office note was prepared with Estate agent. Any transcriptional errors that result from this process are unintentional.

## 2016-01-19 ENCOUNTER — Ambulatory Visit: Payer: Medicare Other | Admitting: Endocrinology

## 2016-03-12 IMAGING — DX DG CHEST 2V
2 series · 2 of 2 positions shown · non-contrast
Comparison: None.

CLINICAL DATA: Cough for 2 months.  History of pneumonia, asthma

EXAM:
CHEST  2 VIEW

[chest pa]
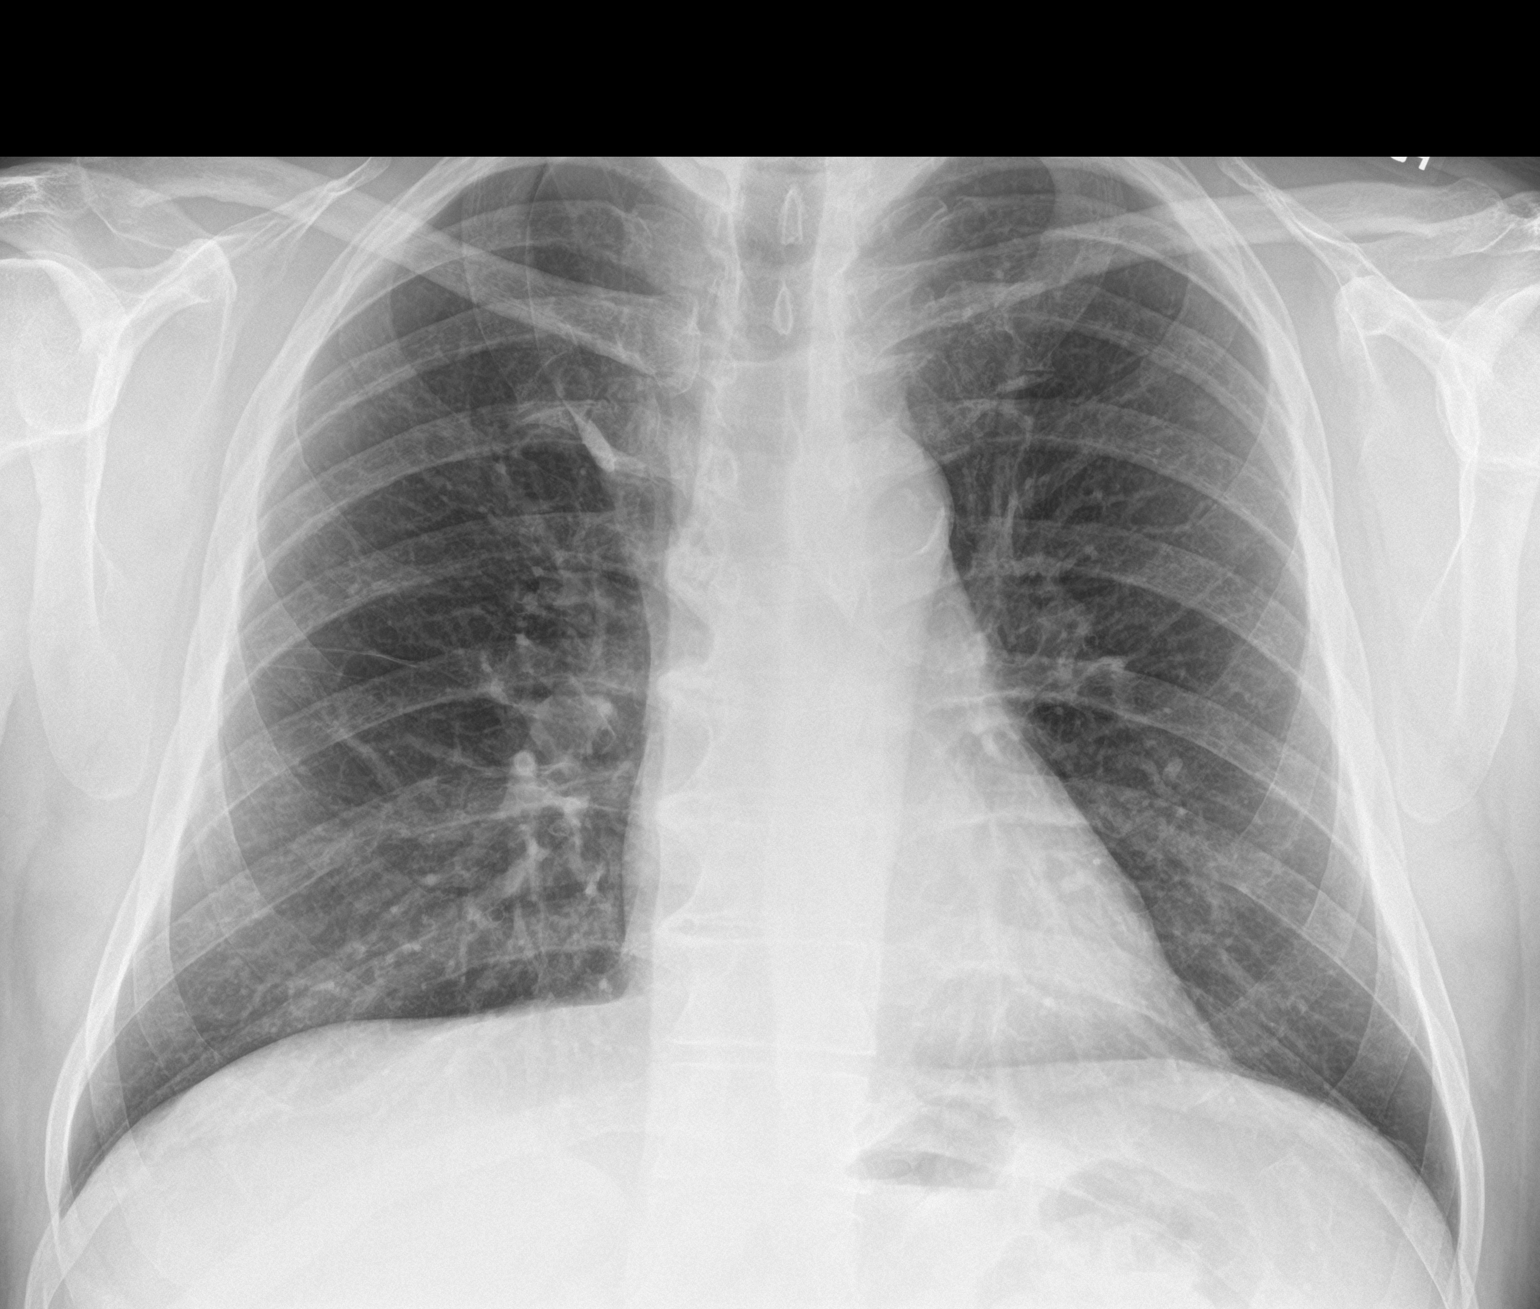

[chest lat]
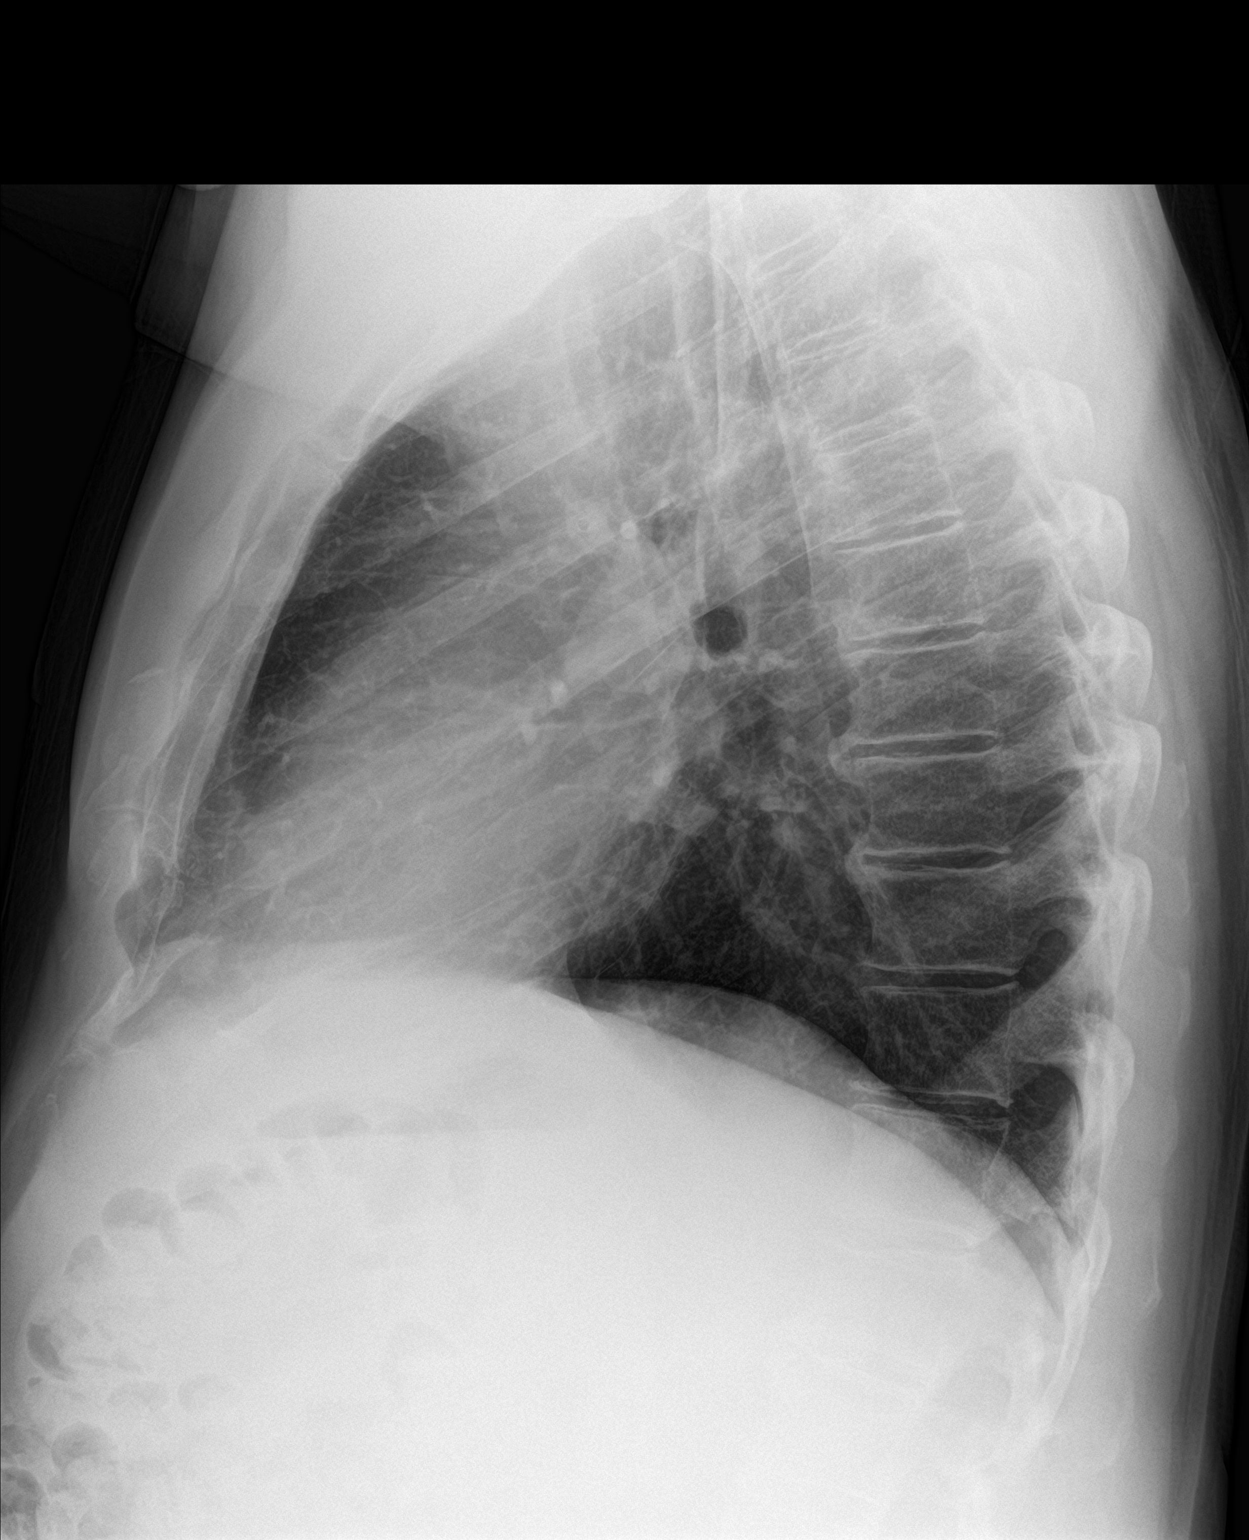

[2 of 2 positions shown; findings below may reference images not displayed]

FINDINGS: The heart size and mediastinal contours are within normal limits.
Both lungs are clear. The visualized skeletal structures are
unremarkable.
IMPRESSION: No active cardiopulmonary disease.

## 2016-04-09 LAB — BASIC METABOLIC PANEL: Creatinine: 1 mg/dL (ref 0.6–1.3)

## 2016-04-09 LAB — HEMOGLOBIN A1C: HEMOGLOBIN A1C: 7.2

## 2016-04-15 ENCOUNTER — Other Ambulatory Visit: Payer: Self-pay | Admitting: *Deleted

## 2016-04-15 ENCOUNTER — Telehealth: Payer: Self-pay | Admitting: Endocrinology

## 2016-04-15 MED ORDER — GLIPIZIDE ER 5 MG PO TB24: 5.0000 mg | ORAL_TABLET | Freq: Every day | ORAL | 1 refills | Status: DC

## 2016-04-15 NOTE — Telephone Encounter (Signed)
rx sent

## 2016-04-15 NOTE — Telephone Encounter (Signed)
Pt wife called and requests a refill of his Glipizide XL sent into [facility].

## 2016-04-29 ENCOUNTER — Other Ambulatory Visit: Payer: Self-pay | Admitting: Endocrinology

## 2016-05-24 ENCOUNTER — Other Ambulatory Visit: Payer: Self-pay

## 2016-05-24 MED ORDER — INSULIN PEN NEEDLE 31G X 5 MM MISC: 1 refills | Status: DC

## 2016-05-27 ENCOUNTER — Telehealth: Payer: Self-pay | Admitting: Endocrinology

## 2016-05-27 NOTE — Telephone Encounter (Signed)
Need a PA of BD ultra fine needles 31 G  [facility] [phone] - [place], [place] - [address] AT [facility] [phone] (Phone) [phone] (Fax)

## 2016-06-09 ENCOUNTER — Other Ambulatory Visit: Payer: Self-pay

## 2016-06-09 MED ORDER — INSULIN PEN NEEDLE 31G X 5 MM MISC: 1 refills | Status: AC

## 2016-06-09 MED ORDER — INSULIN PEN NEEDLE 31G X 5 MM MISC: 2 refills | Status: DC

## 2016-06-30 ENCOUNTER — Ambulatory Visit: Payer: Medicare Other | Admitting: Endocrinology

## 2016-07-19 ENCOUNTER — Telehealth: Payer: Self-pay | Admitting: Endocrinology

## 2016-07-19 NOTE — Telephone Encounter (Signed)
Pt called in to make sure that we have received his lab results from [facility].

## 2016-07-22 ENCOUNTER — Ambulatory Visit: Payer: Medicare Other | Admitting: Endocrinology

## 2016-07-26 ENCOUNTER — Telehealth: Payer: Self-pay | Admitting: Endocrinology

## 2016-07-26 ENCOUNTER — Other Ambulatory Visit: Payer: Self-pay | Admitting: Internal Medicine

## 2016-07-26 MED ORDER — GLIPIZIDE ER 5 MG PO TB24
5.0000 mg | ORAL_TABLET | Freq: Every day | ORAL | 0 refills | Status: DC
Start: 2016-07-26 — End: 2016-08-11

## 2016-07-26 NOTE — Telephone Encounter (Signed)
Pt wife called in and said that [facility] has messed up his medication and he needs a short term supply called in. Glipizide XL  [facility] [phone]

## 2016-07-26 NOTE — Telephone Encounter (Signed)
Patient wife is calling on the status of last message pt glipizide, he is out of his meds, and need it called in today.pb stated pt wife

## 2016-07-27 ENCOUNTER — Other Ambulatory Visit: Payer: Self-pay

## 2016-07-27 NOTE — Telephone Encounter (Signed)
Labs have been received and confirmed by [facility].

## 2016-07-27 NOTE — Telephone Encounter (Signed)
Ordered 07/26/16

## 2016-07-27 NOTE — Telephone Encounter (Signed)
Patient wife called the [facility]  About husband medication glipizide xl, please advise

## 2016-08-11 ENCOUNTER — Ambulatory Visit (INDEPENDENT_AMBULATORY_CARE_PROVIDER_SITE_OTHER): Payer: Medicare Other | Admitting: Endocrinology

## 2016-08-11 ENCOUNTER — Encounter: Payer: Self-pay | Admitting: Endocrinology

## 2016-08-11 ENCOUNTER — Ambulatory Visit: Payer: Medicare Other | Admitting: Endocrinology

## 2016-08-11 ENCOUNTER — Other Ambulatory Visit: Payer: Self-pay

## 2016-08-11 VITALS — BP 124/70 | HR 88 | Ht 72.0 in | Wt 228.0 lb

## 2016-08-11 DIAGNOSIS — E782 Mixed hyperlipidemia: Secondary | ICD-10-CM | POA: Diagnosis not present

## 2016-08-11 DIAGNOSIS — I10 Essential (primary) hypertension: Secondary | ICD-10-CM | POA: Diagnosis not present

## 2016-08-11 DIAGNOSIS — E291 Testicular hypofunction: Secondary | ICD-10-CM | POA: Diagnosis not present

## 2016-08-11 DIAGNOSIS — E1165 Type 2 diabetes mellitus with hyperglycemia: Secondary | ICD-10-CM | POA: Diagnosis not present

## 2016-08-11 MED ORDER — GLUCOSE BLOOD VI STRP: ORAL_STRIP | 2 refills | Status: AC

## 2016-08-11 MED ORDER — LIRAGLUTIDE 18 MG/3ML ~~LOC~~ SOPN: PEN_INJECTOR | SUBCUTANEOUS | 1 refills | Status: DC

## 2016-08-11 MED ORDER — GLIPIZIDE ER 5 MG PO TB24: 5.0000 mg | ORAL_TABLET | Freq: Every day | ORAL | 1 refills | Status: AC

## 2016-08-11 NOTE — Progress Notes (Signed)
Patient ID: [name] [name], male   DOB: [birth year], 66 y.o.   MRN: [id number]   Reason for Appointment: Diabetes follow-up   History of Present Illness   DIAGNOSIS: Made in 2010, Type 2 diabetes mellitus.    He previously was on a regimen of glipizide 5 mg , Januvia and  Invokana.  He had been restarted on Victoza in  4/15 when he was having a tendency to weight gain and not controlling his portions. With gradually increasing the dose he was tolerating 1.8 mg without difficulty  Recent history:   CURRENT medication regimen: Victoza, Jardiance 10 mg, glipizide ER  He has not been seen in follow-up since 12/2015 A1c done in 1/18 through his PCP was 6.7, previously 6.6  Current blood sugar patterns, management and problems identified:  He appears to have gained significant amount of weight since his last visit  He  thinks this is from not exercising as much especially with his respiratory illness last month and also inconsistent diet over the last couple of months  He has checked his blood sugars at various times and these are mostly high late in the evening but better in the daytime and morning  He is taking Jardiance but having some frequency of urination with this  He is still taking glipizide ER and lowest blood sugar at home is 109  Postprandial readings are only a few and are below 200  Side effects from medications: None.   Glucose monitoring: Every other day or so  Glucometer:    One Touch          Blood Glucose readings:   Mean values apply above for all meters except median for One Touch  PRE-MEAL Fasting Lunch Dinner Bedtime Overall  Glucose range: 109-148    143-199    Mean/median: 122  136   165  139    Hypoglycemia: None but he will get a headache when the blood sugars 80-90  Meals: content is variable, has been better with choices.  Occasionally he has high fat meals  He knows that high carbohydrate foods or fruits will raise his blood sugar.    Physical activity: exercise: None recently, previously  biking outside 3/7 Diabetes Educator visit: Most recent: 3/13 .  Dietician visit: Most recent: 09/2011.   Wt Readings from Last 3 Encounters:  08/11/16 228 lb (103.4 kg)  01/07/16 216 lb (98 kg)  09/30/15 212 lb (96.2 kg)   Lab Results  Component Value Date   HGBA1C 7.2 04/09/2016   HGBA1C 6.6 (H) 12/31/2015   HGBA1C 7.7 (H) 09/26/2015   Lab Results  Component Value Date   MICROALBUR <0.7 01/06/2015   LDLCALC 51 12/31/2015   CREATININE 1.0 04/09/2016    Hyperlipidemia:   Patient is on low-dose Crestor 10 mg which he takes 1 alternating  with a half with no further side effects He now is taking 500 mg of Slo-Niacin a day , his dose was Decreased  since it was unclear whether this was causing liver function abnormality No history of CAD.   LDL particle number initially was 1632 . LDL particle number = 1095 in 12/14 at the lipid clinic although particle size still relatively low at 20.3 Last LDL particle number was 1053, previously 1125   Recent lab results:  LDL 83, HDL 46 and triglycerides 182 done on 07/02/2016  Lab Results  Component Value Date   CHOL 122 12/31/2015   CHOL 124 09/26/2015   CHOL 131 01/06/2015  Lab Results  Component Value Date   HDL 50 12/31/2015   HDL 53.60 09/26/2015   HDL 44.60 01/06/2015   Lab Results  Component Value Date   LDLCALC 51 12/31/2015   LDLCALC 50 09/26/2015   LDLCALC 71 01/06/2015   Lab Results  Component Value Date   TRIG 107 12/31/2015   TRIG 102.0 09/26/2015   TRIG 77.0 01/06/2015   Lab Results  Component Value Date   CHOLHDL 2.4 12/31/2015   CHOLHDL 2 09/26/2015   CHOLHDL 3 01/06/2015   Lab Results  Component Value Date   LDLDIRECT 65.8 03/28/2014    Lab Results  Component Value Date   CHOL 122 12/31/2015   HDL 50 12/31/2015   LDLCALC 51 12/31/2015   LDLDIRECT 65.8 03/28/2014   TRIG 107 12/31/2015   CHOLHDL 2.4 12/31/2015                                                Allergies as of 08/11/2016      Reactions   Metformin Diarrhea   Severe diarrhea   Sulfonamide Derivatives    REACTION: Hives   Sulfa Antibiotics Rash      Medication List       Accurate as of 08/11/16 11:59 PM. Always use your most recent med list.          amLODipine-benazepril 10-20 MG capsule Commonly known as:  LOTREL Take 1 capsule by mouth daily.   aspirin 81 MG tablet Take 81 mg by mouth daily.   CO Q 10 PO Take 100 mg by mouth.   empagliflozin 10 MG Tabs tablet Commonly known as:  JARDIANCE Take 10 mg by mouth daily.   fexofenadine 30 MG tablet Commonly known as:  ALLEGRA Take 180 mg by mouth 2 (two) times daily.   fluticasone 50 MCG/ACT nasal spray Commonly known as:  FLONASE Place 2 sprays into both nostrils daily.   glipiZIDE 5 MG 24 hr tablet Commonly known as:  GLIPIZIDE XL Take 1 tablet (5 mg total) by mouth daily with breakfast.   GLUCOSAMINE 1500 COMPLEX PO Take by mouth.   glucose blood test strip Commonly known as:  ONE TOUCH ULTRA TEST Use to check blood sugar 1 time per day.   Insulin Pen Needle 31G X 5 MM Misc Use to inject victoza   liraglutide 18 MG/3ML Sopn Commonly known as:  VICTOZA INJECT 1.8 MG UNDER THE SKIN DAILY   Melatonin 5 MG Tabs Take 2 tablets by mouth at bedtime as needed and may repeat dose one time if needed.   METAMUCIL PO Take by mouth 2 (two) times daily.   multivitamin tablet Take 1 tablet by mouth 2 (two) times daily.   niacin 500 MG tablet Take 500 mg by mouth daily with breakfast. Reported on 10/28/2015   onetouch ultrasoft lancets USE AS DIRECTED   OSTEO BI-FLEX ADV DOUBLE ST PO Take by mouth 2 (two) times daily.   PROBIOTIC PO Take by mouth daily.   rosuvastatin 10 MG tablet Commonly known as:  CRESTOR Take 1 tablet (10 mg total) by mouth daily.   Turmeric 500 MG Caps Take 500 mg by mouth 2 (two) times daily.   Vitamin D3 3000 units  Tabs Take 1 tablet by mouth daily.   cholecalciferol 1000 units tablet Commonly known as:  VITAMIN D Take 1,000 Units by  mouth daily.       Allergies:  Allergies  Allergen Reactions  . Metformin Diarrhea    Severe diarrhea  . Sulfonamide Derivatives     REACTION: Hives  . Sulfa Antibiotics Rash    Past Medical History:  Diagnosis Date  . Allergy    seasonal  . Asthma   . Chronic gouty arthritis   . Depression   . Diabetes mellitus without complication ([place])   . Diverticulosis of colon   . Dysphagia, unspecified(787.20)   . Esophageal stricture   . Family history of colon cancer   . Hiatal hernia with gastroesophageal reflux   . Hyperlipidemia   . Hypertension   . Mild obesity   . Sleep apnea    cpap    Past Surgical History:  Procedure Laterality Date  . ANKLE SURGERY    . COLONOSCOPY    . ESOPHAGOGASTRODUODENOSCOPY    . NASAL SEPTUM SURGERY    . UMBILICAL HERNIA REPAIR      Family History  Problem Relation Age of Onset  . Colon cancer Father     onset at age 38  . Heart disease Father   . Diabetes Mother   . Heart disease Mother     Social History:  reports that he quit smoking about 38 years ago. His smoking use included Cigarettes. He has a 3.00 pack-year smoking history. He has never used smokeless tobacco. He reports that he drinks alcohol. He reports that he does not use drugs.  Review of Systems -  HYPOGONADISM: He thinks this was diagnosed about  The year 2013 by his PCP but no details are available He says he was having symptoms of fatigue and depression at the onset and this had not improved with taking testosterone gel He had been on and off testosterone supplements since then from his urologist Diagnosis of hypogonadism made by urologist T=212 Subsequent testosterone levels were still in the 200+ range   Previous PSA from the primary care physician in 12/15 was 4.4, unchanged from 2014   His testosterone level stays consistently about  the same including a somewhat low free testosterone Treatment has not been pursued as he does not desire treatment and does not seem to have unusual fatigue  Foot exam was done in 2/18  No numbness or tingling  Eye exam 11/17  HYPERTENSION: Controlled, he is on Lotrel from his PCP.   He thinks his blood pressure is around 140 at home   Examination:   BP 124/70   Pulse 88   Ht 6' (1.829 m)   Wt 228 lb (103.4 kg)   SpO2 97%   BMI 30.92 kg/m   Body mass index is 30.92 kg/m.    Diabetic Foot Exam - Simple   Simple Foot Form Diabetic Foot exam was performed with the following findings:  Yes   Visual Inspection No deformities, no ulcerations, no other skin breakdown bilaterally:  Yes Sensation Testing Intact to touch and monofilament testing bilaterally:  Yes Pulse Check Posterior Tibialis and Dorsalis pulse intact bilaterally:  Yes Comments     Assesment:   Diabetes type 2:  See history of present illness for detailed discussion of current diabetes management, blood sugar patterns and problems identified  A1c is reasonably good at 6.7 However has gained weight and has relatively higher postprandial readings Although he can do better with day-to-day management including diet and exercise may benefit from increasing the Jardiance up to 25 mg For now he can increase the  dose to 2 tablets of the 10 mg and that is not if he does not have any increased or excessive polyuria Discussed trying to do some indoor exercises if he cannot get outside to do his biking May need to adjust his blood pressure medications with increasing Jardiance, he will continue monitor blood pressure   LIPIDS: He has fairly good lipid levels but need to review LDL Particle number, he will get this on his lab work next time with his PCP Also LDL is trending higher and not clear if this is from inconsistent diet  HDL is relatively lower but currently will leave him on 500 mg niacin and he had previous mild  liver dysfunction This may also potentially affect glucose control   Continue Crestor as before, May need a higher dose with higher particle number, discussed diet well lab targets and medications  HYPERTENSION: Blood pressure is excellent but he thinks it is higher at home, he will continue to monitor and work with his PCP  Follow-up in 6 months again  Counseling time on subjects discussed above is over 50% of today's 25 minute visit   [name] [name] 08/12/2016, 1:49 PM  .  Note: This office note was prepared with Estate agent. Any transcriptional errors that result from this process are unintentional.

## 2016-08-11 NOTE — Patient Instructions (Signed)
Restart exercise  Check blood sugars on waking up    Also check blood sugars about 2 hours after a meal and do this after different meals by rotation  Recommended blood sugar levels on waking up is 80-120 and about 2 hours after meal is 130-160  Please bring your blood sugar monitor to each visit, thank you   Jardiance 2 of 10

## 2016-08-30 ENCOUNTER — Other Ambulatory Visit: Payer: Self-pay

## 2016-08-30 ENCOUNTER — Telehealth: Payer: Self-pay | Admitting: Endocrinology

## 2016-08-30 MED ORDER — EMPAGLIFLOZIN 25 MG PO TABS: 25.0000 mg | ORAL_TABLET | Freq: Every day | ORAL | 2 refills | Status: AC

## 2016-08-30 NOTE — Telephone Encounter (Signed)
Call in jardiance 25 mg to [facility] in [place] [phone]

## 2016-08-30 NOTE — Telephone Encounter (Signed)
Ordered 08/30/16

## 2016-10-11 ENCOUNTER — Other Ambulatory Visit: Payer: Self-pay | Admitting: Endocrinology

## 2016-10-15 ENCOUNTER — Telehealth: Payer: Self-pay | Admitting: Endocrinology

## 2016-10-15 NOTE — Telephone Encounter (Signed)
[place] specialist [place], called stated he need an order with a physician signature for a lipid protein   [phone]

## 2016-10-15 NOTE — Telephone Encounter (Signed)
Need lipoprotein NMR profile from [facility] please let me know if you cannot find this

## 2016-10-18 ENCOUNTER — Other Ambulatory Visit: Payer: Self-pay | Admitting: Endocrinology

## 2016-10-18 DIAGNOSIS — E782 Mixed hyperlipidemia: Secondary | ICD-10-CM

## 2016-10-18 DIAGNOSIS — E1165 Type 2 diabetes mellitus with hyperglycemia: Secondary | ICD-10-CM

## 2016-10-18 NOTE — Telephone Encounter (Signed)
This has been faxed.

## 2016-10-31 ENCOUNTER — Other Ambulatory Visit: Payer: Self-pay | Admitting: Endocrinology

## 2017-02-22 ENCOUNTER — Ambulatory Visit: Payer: Medicare Other | Admitting: Endocrinology

## 2019-06-04 ENCOUNTER — Encounter: Payer: Self-pay | Admitting: *Deleted

## 2020-08-09 ENCOUNTER — Encounter: Payer: Self-pay | Admitting: Internal Medicine
